# Patient Record
Sex: Female | Born: 1950 | Race: White | Hispanic: No | Marital: Married | Smoking: Never smoker
Health system: Southern US, Community
[De-identification: ages and names within clinical notes are randomized; demographics above are authoritative.]

## PROBLEM LIST (undated history)

## (undated) DIAGNOSIS — M609 Myositis, unspecified: Secondary | ICD-10-CM

## (undated) DIAGNOSIS — T884XXA Failed or difficult intubation, initial encounter: Secondary | ICD-10-CM

## (undated) DIAGNOSIS — M84352P Stress fracture, left femur, subsequent encounter for fracture with malunion: Secondary | ICD-10-CM

## (undated) DIAGNOSIS — N816 Rectocele: Secondary | ICD-10-CM

## (undated) DIAGNOSIS — M359 Systemic involvement of connective tissue, unspecified: Secondary | ICD-10-CM

## (undated) DIAGNOSIS — K8020 Calculus of gallbladder without cholecystitis without obstruction: Secondary | ICD-10-CM

## (undated) DIAGNOSIS — M069 Rheumatoid arthritis, unspecified: Secondary | ICD-10-CM

## (undated) DIAGNOSIS — N952 Postmenopausal atrophic vaginitis: Secondary | ICD-10-CM

## (undated) DIAGNOSIS — K469 Unspecified abdominal hernia without obstruction or gangrene: Secondary | ICD-10-CM

## (undated) DIAGNOSIS — J45909 Unspecified asthma, uncomplicated: Secondary | ICD-10-CM

## (undated) DIAGNOSIS — K219 Gastro-esophageal reflux disease without esophagitis: Secondary | ICD-10-CM

## (undated) DIAGNOSIS — IMO0002 Reserved for concepts with insufficient information to code with codable children: Secondary | ICD-10-CM

## (undated) HISTORY — PX: CARPAL TUNNEL RELEASE: SHX101

## (undated) HISTORY — DX: Rheumatoid arthritis, unspecified: M06.9

## (undated) HISTORY — PX: TOTAL HIP ARTHROPLASTY: SHX124

## (undated) HISTORY — PX: ELBOW SURGERY: SHX618

## (undated) HISTORY — DX: Postmenopausal atrophic vaginitis: N95.2

## (undated) HISTORY — DX: Unspecified abdominal hernia without obstruction or gangrene: K46.9

## (undated) HISTORY — DX: Reserved for concepts with insufficient information to code with codable children: IMO0002

## (undated) HISTORY — DX: Rectocele: N81.6

## (undated) HISTORY — DX: Myositis, unspecified: M60.9

## (undated) HISTORY — PX: COLONOSCOPY: SHX174

---
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
## 1978-11-25 HISTORY — PX: ABDOMINAL HYSTERECTOMY: SHX81

## 1984-11-25 HISTORY — PX: WRIST SURGERY: SHX841

## 1989-11-25 HISTORY — PX: TEMPOROMANDIBULAR JOINT ARTHROPLASTY: SUR76

## 1991-11-26 HISTORY — PX: OTHER SURGICAL HISTORY: SHX169

## 1991-11-26 HISTORY — PX: FOOT SURGERY: SHX648

## 1997-11-25 HISTORY — PX: THYROID SURGERY: SHX805

## 1998-11-25 HISTORY — PX: OTHER SURGICAL HISTORY: SHX169

## 1999-06-06 ENCOUNTER — Encounter: Payer: Self-pay | Admitting: Neurosurgery

## 1999-06-12 ENCOUNTER — Encounter: Payer: Self-pay | Admitting: Neurosurgery

## 1999-06-12 ENCOUNTER — Inpatient Hospital Stay (HOSPITAL_COMMUNITY): Admission: RE | Admit: 1999-06-12 | Discharge: 1999-06-13 | Payer: Self-pay | Admitting: Neurosurgery

## 1999-11-26 HISTORY — PX: SHOULDER ARTHROSCOPY: SHX128

## 2000-03-03 ENCOUNTER — Encounter: Payer: Self-pay | Admitting: Neurosurgery

## 2000-03-03 ENCOUNTER — Ambulatory Visit (HOSPITAL_COMMUNITY): Admission: RE | Admit: 2000-03-03 | Discharge: 2000-03-03 | Payer: Self-pay | Admitting: Neurosurgery

## 2000-11-25 HISTORY — PX: OTHER SURGICAL HISTORY: SHX169

## 2002-01-22 ENCOUNTER — Ambulatory Visit (HOSPITAL_COMMUNITY): Admission: RE | Admit: 2002-01-22 | Discharge: 2002-01-22 | Payer: Self-pay | Admitting: Neurosurgery

## 2002-01-22 ENCOUNTER — Encounter: Payer: Self-pay | Admitting: Neurosurgery

## 2005-09-05 ENCOUNTER — Ambulatory Visit: Payer: Self-pay | Admitting: Internal Medicine

## 2006-09-16 ENCOUNTER — Ambulatory Visit: Payer: Self-pay | Admitting: Internal Medicine

## 2007-01-15 ENCOUNTER — Ambulatory Visit: Payer: Self-pay | Admitting: Unknown Physician Specialty

## 2007-03-02 ENCOUNTER — Ambulatory Visit: Payer: Self-pay | Admitting: Internal Medicine

## 2007-03-04 ENCOUNTER — Ambulatory Visit: Payer: Self-pay | Admitting: Internal Medicine

## 2007-09-18 ENCOUNTER — Ambulatory Visit: Payer: Self-pay | Admitting: Internal Medicine

## 2007-11-26 HISTORY — PX: ANTERIOR AND POSTERIOR VAGINAL REPAIR: SUR5

## 2007-11-29 ENCOUNTER — Emergency Department: Payer: Self-pay | Admitting: Emergency Medicine

## 2008-02-22 ENCOUNTER — Ambulatory Visit (HOSPITAL_COMMUNITY): Admission: RE | Admit: 2008-02-22 | Discharge: 2008-02-22 | Payer: Self-pay | Admitting: Orthopedic Surgery

## 2008-02-29 ENCOUNTER — Inpatient Hospital Stay (HOSPITAL_COMMUNITY): Admission: RE | Admit: 2008-02-29 | Discharge: 2008-03-02 | Payer: Self-pay | Admitting: Orthopedic Surgery

## 2008-03-25 ENCOUNTER — Ambulatory Visit: Payer: Self-pay | Admitting: Urology

## 2008-07-05 ENCOUNTER — Other Ambulatory Visit: Payer: Self-pay

## 2008-07-05 ENCOUNTER — Ambulatory Visit: Payer: Self-pay | Admitting: Obstetrics and Gynecology

## 2008-07-11 ENCOUNTER — Ambulatory Visit: Payer: Self-pay | Admitting: Obstetrics and Gynecology

## 2008-09-20 ENCOUNTER — Ambulatory Visit: Payer: Self-pay | Admitting: Internal Medicine

## 2009-09-22 ENCOUNTER — Ambulatory Visit: Payer: Self-pay | Admitting: Internal Medicine

## 2009-12-27 ENCOUNTER — Encounter: Admission: RE | Admit: 2009-12-27 | Discharge: 2009-12-27 | Payer: Self-pay | Admitting: Neurosurgery

## 2010-06-26 ENCOUNTER — Encounter: Payer: Self-pay | Admitting: Unknown Physician Specialty

## 2010-09-25 ENCOUNTER — Ambulatory Visit: Payer: Self-pay | Admitting: Internal Medicine

## 2010-12-10 ENCOUNTER — Inpatient Hospital Stay (HOSPITAL_COMMUNITY)
Admission: RE | Admit: 2010-12-10 | Discharge: 2010-12-11 | Payer: Self-pay | Source: Home / Self Care | Attending: Neurosurgery | Admitting: Neurosurgery

## 2010-12-10 LAB — BASIC METABOLIC PANEL
BUN: 11 mg/dL (ref 6–23)
CO2: 29 mEq/L (ref 19–32)
Calcium: 9.4 mg/dL (ref 8.4–10.5)
Chloride: 105 mEq/L (ref 96–112)
Creatinine, Ser: 0.79 mg/dL (ref 0.4–1.2)
GFR calc Af Amer: >60 mL/min (ref >60)
GFR calc non Af Amer: >60 mL/min (ref >60)
Glucose, Bld: 94 mg/dL (ref 70–99)
Potassium: 4.5 mEq/L (ref 3.5–5.1)
Sodium: 142 mEq/L (ref 135–145)

## 2010-12-10 LAB — TYPE AND SCREEN
ABO/RH(D): A POS
Antibody Screen: NEGATIVE

## 2010-12-10 LAB — ABO/RH: ABO/RH(D): A POS

## 2010-12-10 LAB — DIFFERENTIAL
Basophils Absolute: 0 10*3/uL (ref 0.0–0.1)
Basophils Relative: 0 % (ref 0–1)
Eosinophils Absolute: 0.3 10*3/uL (ref 0.0–0.7)
Eosinophils Relative: 5 % (ref 0–5)
Lymphocytes Relative: 30 % (ref 12–46)
Lymphs Abs: 2 10*3/uL (ref 0.7–4.0)
Monocytes Absolute: 0.6 10*3/uL (ref 0.1–1.0)
Monocytes Relative: 10 % (ref 3–12)
Neutro Abs: 3.5 10*3/uL (ref 1.7–7.7)
Neutrophils Relative %: 55 % (ref 43–77)

## 2010-12-10 LAB — CBC
HCT: 41.6 % (ref 36.0–46.0)
Hemoglobin: 13.1 g/dL (ref 12.0–15.0)
MCH: 27.9 pg (ref 26.0–34.0)
MCHC: 31.5 g/dL (ref 30.0–36.0)
MCV: 88.5 fL (ref 78.0–100.0)
Platelets: 266 10*3/uL (ref 150–400)
RBC: 4.7 MIL/uL (ref 3.87–5.11)
RDW: 15.5 % (ref 11.5–15.5)
WBC: 6.4 10*3/uL (ref 4.0–10.5)

## 2010-12-10 LAB — SURGICAL PCR SCREEN
MRSA, PCR: NEGATIVE
Staphylococcus aureus: POSITIVE — AB

## 2010-12-18 NOTE — Op Note (Signed)
Crystal [name], Crystal [name]              ACCOUNT NO.:  [id number]  MEDICAL RECORD NO.:  [id number]          PATIENT TYPE:  INP  LOCATION:  3526                         FACILITY:  [facility]  PHYSICIAN:  Kathaleen Maser. Twisha Vanpelt, M.D.    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  PROCEDURE NAME:  Re-exploration of C1-C2, posterior cervical fusion with removal of hardware.  C1-C2 decompressive laminectomy.  Occiput to C1, C2, C3, C4 posterolateral fusion with lateral mass instrumentation and occipital plating.  Occiput to C1, C2, C3, C4 fusion with local autografting and bone graft extender.  SURGEON:  Kathaleen Maser. Theodoro Koval, MD  ASSISTANT:  Donalee Citrin, MD  ANESTHESIA:  General endotracheal.  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  The patient has subsequently since auto- fused her occiput to C1 and C2 down to C6.  There is still some instability at C1-C2 and some resultant basilar impression causing marked stenosis dorsally at C1.  The patient was counseled as to her options.  She is symptomatic with neck pain and symptoms of early upper cervical myelopathy.  We discussed options for management including operative and nonoperative care.  The patient decided to proceed with revision decompression and fusion in hopes of improving her symptoms.  OPERATIVE:  The patient was brought to the operating room, placed on operating table in supine position.  After an adequate level of anesthesia achieved, the patient was carefully turned into the prone position onto bolsters with her head fixed in a neutral head position in a [place] pin headrest.  The patient's posterior cervical region  and occiput region was prepped and draped sterilely.  A 10 blade was used to make a linear incision extending from her occiput down to approximately C4.  This was carried down sharply in the midline.  Subperiosteal dissection was then performed exposing the lamina of C1, C2, C3, and C4 as well as the facet joints of C2, C3, and C4 and the suboccipital bone. Deep self-retaining retractors were placed.  Intraoperative fluoroscopy was used for bony determination.  Previous fusion mass at C1-2 was dissected free.  The upper aspect of the fusion mass was resected. Interspinous wiring and sublaminar wires at C1-2 were then cut and removed.  A complete laminectomy of C1 was performed.  The lateral mass of the C1 were inspected for possible screw placement.  The anatomy was not favorable due to settling of the C1-2 interspace and very prominent vertebral arteries bilaterally.  For that reason, it was decided to perform occiput to C2-3 for instrumentation.  A Medtronic occipital plate was then secured in place with 6 screws.  Pars interarticularis screws at C2 were placed bilaterally using 13-mm variable-headed Vertex Max screws.  Lateral mass screws at C3-C4 were placed bilaterally, first by drilling pilot holes and then by tapping the pilot holes and then subsequent placing 14 mm variable-headed  screws.  The articulated rod was then cut and contoured to the proper position, it was then placed over the occipital plate and the screw heads at C2, C3, and C4 bilaterally.  Locking caps were placed over the screw heads.  Locking caps were then engaged.  Final images revealed good position of bone grafts, hardware at proper operative level with normal alignment of spine.  Further local autograft and Actifuse putty was then placed posterolaterally for later fusion.  Medium Hemovac drain was left in the epidural space.  Wound was then closed in layers with Vicryl sutures. Steri-Strips and sterile  dressings were applied.  There were no complications.  The patient tolerated the procedure well and she returned to the recovery room postop.          ______________________________ Kathaleen Maser. Terree Gaultney, M.D.     HAP/MEDQ  D:  12/10/2010  T:  12/11/2010  Job:  [id number]  Electronically Signed by Julio Sicks M.D. on 12/18/2010 08:15:39 AM

## 2011-02-14 ENCOUNTER — Other Ambulatory Visit: Payer: Self-pay | Admitting: Neurosurgery

## 2011-02-14 ENCOUNTER — Ambulatory Visit
Admission: RE | Admit: 2011-02-14 | Discharge: 2011-02-14 | Disposition: A | Discharge status: Home / Self Care | Payer: Medicare Other | Source: Ambulatory Visit | Attending: Neurosurgery | Admitting: Neurosurgery

## 2011-02-14 DIAGNOSIS — M4802 Spinal stenosis, cervical region: Secondary | ICD-10-CM

## 2011-02-14 DIAGNOSIS — M542 Cervicalgia: Secondary | ICD-10-CM

## 2011-04-09 NOTE — H&P (Signed)
[name], [name]              ACCOUNT NO.:  [id number]   MEDICAL RECORD NO.:  [id number]          PATIENT TYPE:  INP   LOCATION:  0006                         FACILITY:  [facility]   PHYSICIAN:  Ollen Gross, M.D.    DATE OF BIRTH:  02/17/1951   DATE OF ADMISSION:  02/29/2008  DATE OF DISCHARGE:                              HISTORY & PHYSICAL   DATE OF OFFICE VISIT AND HISTORY AND PHYSICAL:  February 12, 2008.   CHIEF COMPLAINT:  Right hip pain.   HISTORY OF PRESENT ILLNESS:  The patient is a 60 year old female who has  been seen by Dr. Lequita Halt for right hip instability.  She had a previous  right total hip by Dr. Kennith Center.  She has known significant rheumatoid  arthritis and has undergone multiple arthroplasty procedures.  She has  had both knees replaced, both hips replaced, an elbow replacement, and  ankle replacements.  The right hip, which was placed about 16 years ago,  has done well up until recently.  She had one episode about 3 years ago  when it slipped out and slid back in.  Unfortunately, back in December  2008, she had a fall and felt as though something shifted in her hip.  She has developed pain, increasing difficulty, and now has developed  some late instability problems.  She also is known to have significant  polyethylene wear.  It is felt she has reached the point where she would  benefit from undergoing revision surgery, possibly just head and liner,  but possibly an acetabular component if needed due to some osteolysis.  Risks and benefits of this procedure have been discussed with the  patient.  She elects to proceed with surgery.   ALLERGIES:  Multiple allergies, to include:  1. MORPHINE.  2. DEMEROL.  3. NALFON.  4. SULFASALAZINE.  5. HIGH DOSES OF B12, although she is able to take B12 by mouth in low      doses.   CURRENT MEDICATIONS:  .  Enbrel, Lodine, Zantac or Protonix, albuterol,  Colace, multivitamin, vitamin E, vitamin B6, calcium with D, B12 in  low  doses, Tylenol Arthritis, fish oil, and estrogen.   PAST MEDICAL HISTORY:  1. Rheumatoid arthritis.  2. History of bronchitis.  3. History of bladder prolapse, which she wears a pessary for, but      this will be removed prior to surgery.   PAST SURGICAL HISTORY:  Multiple joint arthroplasties, with bilateral  hip replacements, bilateral knee replacements, multiple hand joint  implants, including thumb.  She has undergone a total right elbow,  fusion of the left foot, and then a left total ankle.   SOCIAL HISTORY:  Married, retired on disability.  Nonsmoker.  No  alcohol.  Two children.  Husband and family will be assisting with care  after surgery.   FAMILY HISTORY:  Mother with history of hypertension, diabetes, and  stroke.  Brother with history of stroke, hypertension, and diabetes.  Grandmother with lung cancer.  Several aunts with breast cancer.   REVIEW OF SYSTEMS:  GENERAL:  No fevers, chills, or night sweats.  NEURO:  No seizures, syncope, or paralysis.  RESPIRATORY:  Little bit of  shortness of breath on exertion, but no shortness breath at rest.  No  productive cough or hemoptysis.  CARDIOVASCULAR:  No chest pain or  orthopnea.  GI:  No nausea, vomiting, diarrhea, or constipation.  GU: No dysuria, hematuria, or discharge.  MUSCULOSKELETAL:  Pertinent of that to the right hip.   PHYSICAL EXAMINATION:  VITAL SIGNS:  Pulse 72, respirations 14, blood  pressure 138/72.  GENERAL:  A 60 year old white female, well nourished, well developed,  pleasant, excellent historian, alert, oriented, and cooperative.  HEENT: Normocephalic, atraumatic.  Pupils round, reactive.  Oropharynx  clear.  EOMs intact.  NECK:  Supple.  No bruits.  CHEST:  Clear anterior and posterior chest walls.  No rhonchi, rales, or  wheezing.  HEART:  Regular rate and rhythm, without murmur.  S1, S2 noted.  ABDOMEN:  Soft, nontender.  Bowel sounds present.  RECTAL/BREASTS/GENITALIA:  Not done.  Not  pertinent to present illness.  EXTREMITIES:  Right hip:  Flexion 90, 20 degrees of abduction.  Motor  functions intact.   IMPRESSION:  Right hip instability.   PLAN:  The patient will be admitted to [facility] to undergo a  right hip polyethylene exchange versus an acetabular revision.  Surgery  will be performed by Dr. Ollen Gross.  Her medical physician, Dr.  Alonna Buckler, has seen the patient preoperatively and felt her to be  stable for surgery.      Alexzandrew L. Perkins, P.A.C.      Ollen Gross, M.D.  Electronically Signed    ALP/MEDQ  D:  02/25/2008  T:  02/26/2008  Job:  [id number]   cc:   Alonna Buckler, M.D.  [facility]

## 2011-04-09 NOTE — Op Note (Signed)
Crystal [name], Crystal [name]              ACCOUNT NO.:  [id number]   MEDICAL RECORD NO.:  [id number]          PATIENT TYPE:  INP   LOCATION:  1533                         FACILITY:  [facility]   PHYSICIAN:  Ollen Gross, M.D.    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   POSTOPERATIVE DIAGNOSES:  Right hip polyethylene wear with osteolysis  and instability.   PROCEDURE:  Right hip polyethylene liner and femoral head revision with  bone grafting.   SURGEON:  Ollen Gross, M.D.   ASSISTANT:  Avel Peace PA-C   ANESTHESIA:  Spinal.   ESTIMATED BLOOD LOSS:  200.   DRAIN:  None.   COMPLICATIONS:  None.   CONDITION:  Stable to recovery.   CLINICAL NOTE:  Crystal [name] is a 60 year old female with severe  rheumatoid arthritis, who had a right total hip done in the 1990s.  She  had done very well until recently in the past few months.  She started  to develop instability symptoms with dislocation and subsequent self  relocation.  This happened on several occasions.  She had radiographic  evidence of eccentric polyethylene wear with osteolysis.  Given the  instability and wear, it was decided to pursue revision hip replacement.  We did discuss in detail the possibility of just replacing the bearing  surfaces versus having to replace the acetabular component or femoral  component if they were loose or malpositioned.  She elected to proceed  with surgical treatment.   PROCEDURE IN DETAIL:  After the successful administration of spinal  anesthetic, the patient was placed in left lateral decubitus position  with the right side up and held with the hip positioner.  The right  lower extremity was isolated from the perineum with plastic drapes and  prepped and draped in the usual sterile fashion.  A short posterolateral  incision was made with 10 blade through subcutaneous tissue to the level  of the fascia lata, which was incised in line with the skin incision.  The sciatic nerve was palpated and protected, and short external  rotators isolated off the femur.  Our previous approach must have been  lateral as the short external rotators had not been touched before.  We  did incise the rotators and elevate them off the femur.  There was a  large swollen pseudocapsule.  I incised this and a tremendous amount of  osteolytic debris came out of the joint.  It was not a lot of fluid, it  was more tissue debris.  I debrided and removed the capsule and then  removed the wear debris.  She had a ceramic femoral head.  I dislocated  the hip and removed the femoral head.  The femoral component is in good  position and is well fixed.  We had a little bit of proximal bone loss  from stress shielding, but the component was solid.  There was a lot of  osteolytic debris along the  flexor tendon of the iliopsoas, and I  removed that debris.  We then retracted the femur anteriorly to gain  acetabular exposure.   The soft tissue overriding the acetabular liner was removed.  I was then  able to easily remove the acetabular liner from the shell.  She did have  evidence of eccentric, but not complete breakthrough.  The acetabular  component is well fixed.  I threaded the impactor into the central hole  of the acetabular shell and the shell and pelvis were moving together as  a single unit, consistent with a well ingrown shell.  Through the apex  hole, I was able to debride a fair amount of lytic debris.  We  thoroughly irrigated this and suctioned out a lot of the debris.  We  then packed cancellous bone graft, allograft freeze-dried into the  defect through the hole in the cup.  Once this was completed, then we  put a 32 mm trial liner, neutral +4 for the 52 mm Duraloc acetabular  shell.  We then placed a 32 mm +5 trial femoral  head.  The hip was  reduced with great stability.  There was full extension, full external  rotation, 70 degrees flexion, 40 degrees adduction, 90 degrees internal  rotation, 90 degrees of flexion and 70 degrees of internal rotation.  By  placing the right leg on top of the left, it fell like the leg lengths  were now equal.  The hip was then dislocated and the trials removed.  We  changed the locking ring with a Duraloc cup, replacing the old ring with  a new one.  We then placed the 32 mm neutral +4 Marathon liner for the  52 mm Duraloc cup.  We then placed a 32 +5 large tapered femoral head  for the AML stem.  The hip was reduced with the same stability  parameters.  The wound was copiously irrigated with saline solution.  The short rotators were reattached to the femur through drill holes with  Ethibond suture.  The fascia lata was closed with interrupted #1 Vicryl,  subcu closed with #1-0 and #2-0 Vicryl and skin with staples.  The  incisions cleaned and dried and a bulky sterile dressing applied.  She  was placed into a knee immobilizer, awakened and transported to recovery  in stable condition.      Ollen Gross, M.D.  Electronically Signed     FA/MEDQ  D:  02/29/2008  T:  02/29/2008  Job:  [id number]

## 2011-04-09 NOTE — Consult Note (Signed)
Crystal [name], Crystal [name]              ACCOUNT NO.:  [id number]   MEDICAL RECORD NO.:  [id number]          PATIENT TYPE:  INP   LOCATION:  0006                         FACILITY:  [facility]   PHYSICIAN:  Antony Contras, MD     DATE OF BIRTH:  Apr 28, 1951   DATE OF CONSULTATION:  02/22/2008  DATE OF DISCHARGE:                                 CONSULTATION   REQUESTING PHYSICIAN:  Dr. Lequita Halt.   CHIEF COMPLAINT:  Throat pain.   HISTORY OF PRESENT ILLNESS:  The patient is a 60 year old white female  with a history of rheumatoid arthritis pain who presented to the  operating room today in order to undergo hip surgery.  The patient has a  history of C1-C2 cervical spine fusion.  Intubation was attempted using  a glide scope but was unsuccessful.  During intubation attempts, a  laceration was seen in the pharyngeal wall that the anesthesiologist  described as a through-and-through laceration of the right  pharyngoepiglottic fold.  The surgery was cancelled and the patient was  brought to the recovery room.  In the recovery room, she complains of  throat pain that is moderate.  She is able to swallow secretions.  She  feels a need to hock up phlegm.  Otherwise, she is without complaint.   PAST MEDICAL HISTORY:  1. Rheumatoid arthritis.  2. Anemia.   PAST SURGICAL HISTORY:  1. Multiple orthopedic surgeries.  2. Thyroid surgery.  3. C1-C2 cervical fusion in 2000.  4. Hysterectomy.   MEDICATIONS:  Enbrel, Lodine, multivitamin, calcium, Protonix, fish oil,  Synthroid, vitamin E, vitamin B6, Colace, Tylenol.   ALLERGIES:  DEMEROL, MORPHINE, NALFON, SULFA, and B12.   FAMILY HISTORY:  Hypertension, diabetes, lung cancer, breast cancer,  colon cancer, stroke.   SOCIAL HISTORY:  The patient is married and is on disability.  She is  not a smoker and not a drinker. She has two children.   REVIEW OF SYSTEMS:  Positive for joint pain, joint swelling, fatigue and  shortness of breath and urinary  frequency.  All other systems are  negative except as listed above.   PHYSICAL EXAM:  VITAL SIGNS:  Afebrile. Vital signs stable.  GENERAL:  The patient is in no acute distress, pleasant and cooperative.  VOICE:  The voice is normal.  FACE:  There are no abnormalities of the face.  EYES:  Extraocular movements intact. Pupils are equal, round and  reactive to light.  NOSE:  The external nose is normal and the nasal passages are patent.  EARS:  External ears are normal and ear canals are patent.  ORAL CAVITY, OROPHARYNX:  Lips, teeth and gums are normal.  The tongue  is normal and the floor of the mouth is normal.  There is a small  laceration on the right soft palate and with some surrounding bruising.  The remainder of the oropharynx is normal without enlarged tonsils.  NECK:  The neck is normal to palpation with no mass or tenderness.  There is a thyroid scar inferiorly.  SALIVARY GLANDS:  The salivary glands are normal to palpation.  CRANIAL NERVES:  II-XII grossly intact.  LYMPHATICS:  There are no enlarged lymph nodes in the neck.  THYROID:  The thyroid is normal to palpation.   ASSESSMENT:  The patient is a 60 year old white female with rheumatoid  arthritis who sustained a pharyngeal laceration during a difficult  intubation.   PLAN:  The pharynx will be more thoroughly examined using fiberoptic  endoscope. Recommendations will follow in that dictation.      Antony Contras, MD  Electronically Signed     DDB/MEDQ  D:  02/22/2008  T:  02/23/2008  Job:  [id number]

## 2011-04-09 NOTE — Op Note (Signed)
NAMEJENNEY, [name]              ACCOUNT NO.:  [id number]   MEDICAL RECORD NO.:  [id number]          PATIENT TYPE:  INP   LOCATION:  0006                         FACILITY:  [facility]   PHYSICIAN:  Antony Contras, MD     DATE OF BIRTH:  01-Jun-1951   DATE OF PROCEDURE:  02/22/2008  DATE OF DISCHARGE:                               OPERATIVE REPORT   PREOPERATIVE DIAGNOSIS:  Pharyngeal laceration and throat pain.   POSTOPERATIVE DIAGNOSIS:  Pharyngeal laceration and throat pain.   PROCEDURE:  Transnasal fiberoptic laryngoscopy.   SURGEON:  Dr. Christia Reading   ANESTHESIA:  Topical.   COMPLICATIONS:  None.   INDICATIONS:  The patient 60 year old white female rheumatoid arthritis  who was scheduled for hip surgery today.  Intubation was difficult due  to a C1-C2 spinal fusion.  A glide scope was used but intubation could  not be performed successfully.  During evaluation, a pharyngeal  laceration was noted.  Fiberoptic examination of the pharynx is being  performed to evaluate the laceration.   FINDINGS:  The patient was found to have ecchymosis and edema of the  right posterior supraglottic larynx that was not obstructing.  There was  no obvious laceration seen on examination with the fiberoptic telescope  but this may not have been able to be seen at the pharyngoepiglottic  fold due to tongue position.  The tongue was protruded during the  examination but again no laceration was seen.  The glottis was found to  be normal with and normally mobile vocal folds.  There is no obstructing  edema seen.   DESCRIPTION OF PROCEDURE:  The patient was identified in the [facility]  recovery room.  The right nasal passage was dripped with lidocaine.  Fiberoptic laryngoscope was then passed through the right nasal passage  and used to view the pharynx and larynx.  Findings were noted above.  After this, the telescope was removed and the patient was returned to  PACU care.   RECOMMENDATIONS:   By the description of the anesthesiologist, the  laceration involved with a through-and-through laceration on the right  pharyngoepiglottic fold.  This should not cause any problems as far as  the neck infection or mediastinitis.  I do not see any other lacerations  except for one on the right soft palate that also should  not cause a problem.  I do not see any need for diet restrictions or  antibiotic therapy.  The patient should be fine to be discharged from  PACU without any additional therapy and without diet restrictions.  The  patient can be rescheduled for orthopedic surgery whenever it is  convenient.      Antony Contras, MD  Electronically Signed     DDB/MEDQ  D:  02/22/2008  T:  02/23/2008  Job:  [id number]

## 2011-04-12 NOTE — Discharge Summary (Signed)
Crystal [name], Crystal [name]              ACCOUNT NO.:  [id number]   MEDICAL RECORD NO.:  [id number]          PATIENT TYPE:  INP   LOCATION:  1609                         FACILITY:  [facility]   PHYSICIAN:  Ollen Gross, M.D.    DATE OF BIRTH:  1950-12-31   DATE OF ADMISSION:  02/29/2008  DATE OF DISCHARGE:  03/02/2008                               DISCHARGE SUMMARY   ADMISSION DIAGNOSES:  1. Hip instability, right hip.  2. Rheumatoid arthritis.  3. History of bronchitis.  4. History of bladder prolapse.   DISCHARGE DIAGNOSES:  1. 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  2. Rheumatoid arthritis.  3. History of bronchitis.  4. History of bladder prolapse.   PROCEDURE:  On February 29, 2008 right hip polyethylene liner, femoral head  revision with bone grafting.  Surgeon Dr. Lequita Halt, assistant Avel Peace, PA-C.  Spinal anesthesia.   CONSULTATIONS:  None.   BRIEF HISTORY:  [name] is a 59 year old female with severe  rheumatoid arthritis who had a total hip done in the 90s.  Doing very  well until recently.  Over the past few months, started developing  instability symptoms, dislocation and subsequent self-relocation.  This  has happened on several occasions.  Radiographically, she has some  eccentric polyethylene wear and osteolysis.  Given the instability and  wear, it was decided to proceed with hip revision.   LABORATORY DATA:  CBC on March 01, 2008 showed hemoglobin 10.8,  hematocrit 31.0, white cell count 7.6.  Followup hemoglobin 11 and a  hematocrit of 32.1.  PT/INR on March 01, 2008 showed PT of 14, INR 1.1.  Followup PT/INR 18.9 and 1.5.  BMET:  Low sodium of 134, remaining BMET  within normal limits.  Followup BMET all within normal limits, with the  exception of mildly elevated glucose of 125.   HOSPITAL COURSE:  The patient was admitted to [facility],  taken the OR, underwent the above stated procedure without complication.  The patient tolerated the procedure well and later transferred to the  recovery room and orthopedic floor.  Started on PCA and p.o. analgesics.  Doing extremely well on the morning of day #1.  Looked great, good  output.  Discontinued the PCA and knee immobilizer.  Decreased the  fluids.  Hemoglobin was 10.  Had a head and liner exchange.  Actually  got up and walked about 200 feet on day #1.  Was doing fantastic.  Seen  on the morning of day #2.  Felt good, only a little bit of incisional  burning.  She wanted to go home.  We felt that she needed a full day of  therapy, so we kept her in that day until the afternoon.  She was seen  by Dr. Lequita Halt, doing great, tolerating medications, and she was  discharged home on the evening of March 02, 2008.   DISPOSITION:  Patient discharged home on March 02, 2008.   DISCHARGE MEDICATIONS:  Darvocet, Robaxin, Coumadin.   DIET:  Heart-healthy diet.   ACTIVITY:  She is weightbearing as tolerated, right lower extremity.  Home health PT, home health nursing, total hip protocol, hip  precautions.   FOLLOW UP:  10 days after surgery.   CONDITION ON DISCHARGE:  Improved.      Alexzandrew L. Perkins, P.A.C.      Ollen Gross, M.D.  Electronically Signed    ALP/MEDQ  D:  04/07/2008  T:  04/07/2008  Job:  [id number]   cc:   Ollen Gross, M.D.  Fax: [phone]

## 2011-08-19 LAB — COMPREHENSIVE METABOLIC PANEL
ALT: 18
AST: 22
Albumin: 4.2
Alkaline Phosphatase: 97
BUN: 12
CO2: 27
Calcium: 9.9
Chloride: 104
Creatinine, Ser: 0.57
GFR calc Af Amer: >60
GFR calc non Af Amer: >60
Glucose, Bld: 100 — ABNORMAL HIGH
Potassium: 4.3
Sodium: 141
Total Bilirubin: 1
Total Protein: 7.8

## 2011-08-19 LAB — URINALYSIS, ROUTINE W REFLEX MICROSCOPIC
Glucose, UA: NEGATIVE
Hgb urine dipstick: NEGATIVE
Ketones, ur: NEGATIVE
Nitrite: NEGATIVE
Protein, ur: NEGATIVE
Specific Gravity, Urine: 1.005
Urobilinogen, UA: 0.2
pH: 7.5

## 2011-08-19 LAB — TYPE AND SCREEN
ABO/RH(D): A POS
Antibody Screen: NEGATIVE

## 2011-08-19 LAB — CBC
HCT: 40
Hemoglobin: 13.4
MCHC: 33.5
MCV: 88.9
Platelets: 319
RBC: 4.5
RDW: 13.7
WBC: 6.5

## 2011-08-19 LAB — URINE MICROSCOPIC-ADD ON

## 2011-08-19 LAB — PROTIME-INR
INR: 0.9
Prothrombin Time: 12.7

## 2011-08-19 LAB — APTT: aPTT: 32

## 2011-08-19 LAB — ABO/RH: ABO/RH(D): A POS

## 2011-08-20 LAB — BASIC METABOLIC PANEL
BUN: 6
CO2: 27
CO2: 28
Chloride: 105
Creatinine, Ser: 0.64
GFR calc Af Amer: >60
GFR calc Af Amer: >60
Glucose, Bld: 142 — ABNORMAL HIGH
Potassium: 3.8
Potassium: 4
Sodium: 134 — ABNORMAL LOW

## 2011-08-20 LAB — CBC
HCT: 31 — ABNORMAL LOW
HCT: 32.1 — ABNORMAL LOW
Hemoglobin: 10.8 — ABNORMAL LOW
MCHC: 34.4
MCHC: 34.7
MCV: 89.5
RBC: 3.49 — ABNORMAL LOW
RBC: 3.58 — ABNORMAL LOW
RDW: 13.3

## 2011-08-20 LAB — PROTIME-INR: Prothrombin Time: 18.9 — ABNORMAL HIGH

## 2011-08-20 LAB — TYPE AND SCREEN
ABO/RH(D): A POS
Antibody Screen: NEGATIVE

## 2011-10-14 ENCOUNTER — Ambulatory Visit: Payer: Self-pay | Admitting: Internal Medicine

## 2012-03-13 DIAGNOSIS — Z9660 Presence of unspecified orthopedic joint implant: Secondary | ICD-10-CM | POA: Insufficient documentation

## 2012-03-16 DIAGNOSIS — M069 Rheumatoid arthritis, unspecified: Secondary | ICD-10-CM | POA: Insufficient documentation

## 2012-03-16 DIAGNOSIS — M25579 Pain in unspecified ankle and joints of unspecified foot: Secondary | ICD-10-CM | POA: Insufficient documentation

## 2012-10-29 ENCOUNTER — Ambulatory Visit: Payer: Self-pay | Admitting: Internal Medicine

## 2013-11-01 ENCOUNTER — Ambulatory Visit: Payer: Self-pay | Admitting: Family Medicine

## 2013-12-28 ENCOUNTER — Ambulatory Visit: Payer: Self-pay | Admitting: Family Medicine

## 2014-01-19 ENCOUNTER — Ambulatory Visit: Payer: Self-pay | Admitting: Rheumatology

## 2014-04-08 LAB — COMPREHENSIVE METABOLIC PANEL
ALBUMIN: 3.4 g/dL (ref 3.4–5.0)
ANION GAP: 7 (ref 7–16)
AST: 15 U/L (ref 15–37)
Alkaline Phosphatase: 109 U/L
BILIRUBIN TOTAL: 0.9 mg/dL (ref 0.2–1.0)
BUN: 13 mg/dL (ref 7–18)
CALCIUM: 9.5 mg/dL (ref 8.5–10.1)
CREATININE: 0.44 mg/dL — AB (ref 0.60–1.30)
Chloride: 103 mmol/L (ref 98–107)
Co2: 30 mmol/L (ref 21–32)
EGFR (African American): >60
GLUCOSE: 87 mg/dL (ref 65–99)
Osmolality: 279 (ref 275–301)
Potassium: 3.8 mmol/L (ref 3.5–5.1)
SGPT (ALT): 16 U/L (ref 12–78)
Sodium: 140 mmol/L (ref 136–145)
TOTAL PROTEIN: 7.3 g/dL (ref 6.4–8.2)

## 2014-04-08 LAB — PROTIME-INR
INR: 1
PROTHROMBIN TIME: 12.9 s (ref 11.5–14.7)

## 2014-04-08 LAB — CBC WITH DIFFERENTIAL/PLATELET
BASOS ABS: 0 10*3/uL (ref 0.0–0.1)
BASOS PCT: 0.3 %
EOS ABS: 0 10*3/uL (ref 0.0–0.7)
EOS PCT: 0.1 %
HCT: 38 % (ref 35.0–47.0)
HGB: 12.4 g/dL (ref 12.0–16.0)
LYMPHS PCT: 27.2 %
Lymphocyte #: 1.4 10*3/uL (ref 1.0–3.6)
MCH: 28.9 pg (ref 26.0–34.0)
MCHC: 32.6 g/dL (ref 32.0–36.0)
MCV: 89 fL (ref 80–100)
MONO ABS: 0.4 x10 3/mm (ref 0.2–0.9)
Monocyte %: 7.8 %
Neutrophil #: 3.3 10*3/uL (ref 1.4–6.5)
Neutrophil %: 64.6 %
PLATELETS: 238 10*3/uL (ref 150–440)
RBC: 4.28 10*6/uL (ref 3.80–5.20)
RDW: 18.5 % — AB (ref 11.5–14.5)
WBC: 5.1 10*3/uL (ref 3.6–11.0)

## 2014-04-08 LAB — TROPONIN I

## 2014-04-08 LAB — APTT: ACTIVATED PTT: 37.1 s — AB (ref 23.6–35.9)

## 2014-04-09 ENCOUNTER — Ambulatory Visit: Payer: Self-pay | Admitting: Neurology

## 2014-04-09 DIAGNOSIS — I519 Heart disease, unspecified: Secondary | ICD-10-CM

## 2014-04-10 ENCOUNTER — Inpatient Hospital Stay: Payer: Self-pay | Admitting: Internal Medicine

## 2014-04-11 LAB — LIPID PANEL
Cholesterol: 109 mg/dL (ref 0–200)
HDL Cholesterol: 46 mg/dL (ref 40–60)
Ldl Cholesterol, Calc: 41 mg/dL (ref 0–100)
Triglycerides: 109 mg/dL (ref 0–200)
VLDL Cholesterol, Calc: 22 mg/dL (ref 5–40)

## 2014-04-13 LAB — COMPREHENSIVE METABOLIC PANEL
ALK PHOS: 102 U/L
ANION GAP: 5 — AB (ref 7–16)
AST: 28 U/L (ref 15–37)
Albumin: 3.4 g/dL (ref 3.4–5.0)
BUN: 12 mg/dL (ref 7–18)
Bilirubin,Total: 0.5 mg/dL (ref 0.2–1.0)
CALCIUM: 9.3 mg/dL (ref 8.5–10.1)
CO2: 29 mmol/L (ref 21–32)
CREATININE: 0.62 mg/dL (ref 0.60–1.30)
Chloride: 106 mmol/L (ref 98–107)
Glucose: 104 mg/dL — ABNORMAL HIGH (ref 65–99)
Osmolality: 279 (ref 275–301)
Potassium: 4 mmol/L (ref 3.5–5.1)
SGPT (ALT): 13 U/L (ref 12–78)
Sodium: 140 mmol/L (ref 136–145)
TOTAL PROTEIN: 7.5 g/dL (ref 6.4–8.2)

## 2014-04-13 LAB — TROPONIN I: Troponin-I: <0.02 ng/mL

## 2014-04-13 LAB — URINALYSIS, COMPLETE
BACTERIA: NONE SEEN
Bilirubin,UR: NEGATIVE
Blood: NEGATIVE
Glucose,UR: NEGATIVE mg/dL (ref 0–75)
KETONE: NEGATIVE
LEUKOCYTE ESTERASE: NEGATIVE
NITRITE: NEGATIVE
PH: 7 (ref 4.5–8.0)
Protein: NEGATIVE
RBC, UR: NONE SEEN /HPF (ref 0–5)
Specific Gravity: 1.005 (ref 1.003–1.030)
WBC UR: NONE SEEN /HPF (ref 0–5)

## 2014-04-13 LAB — CBC WITH DIFFERENTIAL/PLATELET
BASOS ABS: 0 10*3/uL (ref 0.0–0.1)
Basophil %: 0.4 %
EOS PCT: 0.1 %
Eosinophil #: 0 10*3/uL (ref 0.0–0.7)
HCT: 39.3 % (ref 35.0–47.0)
HGB: 12.8 g/dL (ref 12.0–16.0)
Lymphocyte #: 1.5 10*3/uL (ref 1.0–3.6)
Lymphocyte %: 32 %
MCH: 28.9 pg (ref 26.0–34.0)
MCHC: 32.5 g/dL (ref 32.0–36.0)
MCV: 89 fL (ref 80–100)
MONO ABS: 0.5 x10 3/mm (ref 0.2–0.9)
MONOS PCT: 10.9 %
NEUTROS PCT: 56.6 %
Neutrophil #: 2.7 10*3/uL (ref 1.4–6.5)
PLATELETS: 287 10*3/uL (ref 150–440)
RBC: 4.43 10*6/uL (ref 3.80–5.20)
RDW: 18 % — AB (ref 11.5–14.5)
WBC: 4.7 10*3/uL (ref 3.6–11.0)

## 2014-04-13 LAB — CK TOTAL AND CKMB (NOT AT ARMC)
CK, Total: 96 U/L
CK-MB: 2.1 ng/mL (ref 0.5–3.6)

## 2014-04-13 LAB — PROTIME-INR
INR: 0.9
PROTHROMBIN TIME: 12.4 s (ref 11.5–14.7)

## 2014-04-14 ENCOUNTER — Inpatient Hospital Stay: Payer: Self-pay | Admitting: Internal Medicine

## 2014-04-14 LAB — COMPREHENSIVE METABOLIC PANEL
ALK PHOS: 91 U/L
ANION GAP: 9 (ref 7–16)
Albumin: 3 g/dL — ABNORMAL LOW (ref 3.4–5.0)
BUN: 14 mg/dL (ref 7–18)
Bilirubin,Total: 0.4 mg/dL (ref 0.2–1.0)
CALCIUM: 8.9 mg/dL (ref 8.5–10.1)
CHLORIDE: 106 mmol/L (ref 98–107)
Co2: 26 mmol/L (ref 21–32)
Creatinine: 0.54 mg/dL — ABNORMAL LOW (ref 0.60–1.30)
EGFR (African American): >60
EGFR (Non-African Amer.): >60
GLUCOSE: 97 mg/dL (ref 65–99)
Osmolality: 282 (ref 275–301)
POTASSIUM: 3.7 mmol/L (ref 3.5–5.1)
SGOT(AST): 10 U/L — ABNORMAL LOW (ref 15–37)
SGPT (ALT): 10 U/L — ABNORMAL LOW (ref 12–78)
Sodium: 141 mmol/L (ref 136–145)
TOTAL PROTEIN: 6.8 g/dL (ref 6.4–8.2)

## 2014-04-14 LAB — CBC WITH DIFFERENTIAL/PLATELET
Basophil #: 0 10*3/uL (ref 0.0–0.1)
Basophil %: 0.5 %
EOS ABS: 0 10*3/uL (ref 0.0–0.7)
EOS PCT: 0.2 %
HCT: 36.3 % (ref 35.0–47.0)
HGB: 11.7 g/dL — ABNORMAL LOW (ref 12.0–16.0)
LYMPHS ABS: 1.7 10*3/uL (ref 1.0–3.6)
Lymphocyte %: 35 %
MCH: 28.8 pg (ref 26.0–34.0)
MCHC: 32.3 g/dL (ref 32.0–36.0)
MCV: 89 fL (ref 80–100)
Monocyte #: 0.5 x10 3/mm (ref 0.2–0.9)
Monocyte %: 10.1 %
NEUTROS ABS: 2.7 10*3/uL (ref 1.4–6.5)
NEUTROS PCT: 54.2 %
PLATELETS: 274 10*3/uL (ref 150–440)
RBC: 4.06 10*6/uL (ref 3.80–5.20)
RDW: 18.4 % — ABNORMAL HIGH (ref 11.5–14.5)
WBC: 5 10*3/uL (ref 3.6–11.0)

## 2014-04-14 LAB — PROTIME-INR
INR: 1
Prothrombin Time: 12.7 secs (ref 11.5–14.7)

## 2014-04-14 LAB — CSF CC+PROT+GLU+CULT PANEL
CSF Tube #: 1
EOS PCT: 0 %
Glucose, CSF: 50 mg/dL (ref 40–75)
Lymphocytes: 100 %
MONOCYTES/MACROPHAGES: 0 %
NEUTROS PCT: 0 %
OTHER CELLS: 0 %
PROTEIN, CSF: 118 mg/dL — AB (ref 15–45)
RBC (CSF): 109 /mm3
WBC (CSF): 3 /mm3

## 2014-04-14 LAB — CSF CELL COUNT WITH DIFFERENTIAL
CSF Tube #: 3
EOS PCT: 0 %
Lymphocytes: 0 %
MONOCYTES/MACROPHAGES: 0 %
Neutrophils: 0 %
Other Cells: 0 %
RBC (CSF): 49 /mm3
WBC (CSF): 0 /mm3

## 2014-04-14 LAB — INDIA INK CSF

## 2014-04-14 LAB — SEDIMENTATION RATE
Erythrocyte Sed Rate: 34 mm/hr — ABNORMAL HIGH (ref 0–30)
Erythrocyte Sed Rate: 38 mm/hr — ABNORMAL HIGH (ref 0–30)

## 2014-04-14 LAB — TROPONIN I

## 2014-04-14 LAB — MAGNESIUM: MAGNESIUM: 1.8 mg/dL

## 2014-04-18 LAB — CBC WITH DIFFERENTIAL/PLATELET
BASOS PCT: 0.1 %
Basophil #: 0 10*3/uL (ref 0.0–0.1)
Eosinophil #: 0 10*3/uL (ref 0.0–0.7)
Eosinophil %: 0 %
HCT: 33.6 % — ABNORMAL LOW (ref 35.0–47.0)
HGB: 11.1 g/dL — ABNORMAL LOW (ref 12.0–16.0)
Lymphocyte #: 1.6 10*3/uL (ref 1.0–3.6)
Lymphocyte %: 20.1 %
MCH: 29.1 pg (ref 26.0–34.0)
MCHC: 33.1 g/dL (ref 32.0–36.0)
MCV: 88 fL (ref 80–100)
Monocyte #: 0.5 x10 3/mm (ref 0.2–0.9)
Monocyte %: 6.8 %
Neutrophil #: 5.8 10*3/uL (ref 1.4–6.5)
Neutrophil %: 73 %
PLATELETS: 298 10*3/uL (ref 150–440)
RBC: 3.82 10*6/uL (ref 3.80–5.20)
RDW: 17.6 % — ABNORMAL HIGH (ref 11.5–14.5)
WBC: 7.9 10*3/uL (ref 3.6–11.0)

## 2014-04-18 LAB — BASIC METABOLIC PANEL
ANION GAP: 7 (ref 7–16)
BUN: 13 mg/dL (ref 7–18)
CALCIUM: 8.7 mg/dL (ref 8.5–10.1)
CO2: 28 mmol/L (ref 21–32)
CREATININE: 0.62 mg/dL (ref 0.60–1.30)
Chloride: 102 mmol/L (ref 98–107)
EGFR (African American): >60
Glucose: 115 mg/dL — ABNORMAL HIGH (ref 65–99)
Osmolality: 275 (ref 275–301)
POTASSIUM: 3.7 mmol/L (ref 3.5–5.1)
SODIUM: 137 mmol/L (ref 136–145)

## 2014-05-10 ENCOUNTER — Ambulatory Visit: Payer: Self-pay | Admitting: Neurology

## 2014-05-11 DIAGNOSIS — Z96629 Presence of unspecified artificial elbow joint: Secondary | ICD-10-CM | POA: Insufficient documentation

## 2014-08-12 DIAGNOSIS — E039 Hypothyroidism, unspecified: Secondary | ICD-10-CM | POA: Insufficient documentation

## 2014-09-20 DIAGNOSIS — K6289 Other specified diseases of anus and rectum: Secondary | ICD-10-CM | POA: Insufficient documentation

## 2014-09-20 DIAGNOSIS — Z8719 Personal history of other diseases of the digestive system: Secondary | ICD-10-CM | POA: Insufficient documentation

## 2014-10-19 ENCOUNTER — Ambulatory Visit: Payer: Self-pay | Admitting: Unknown Physician Specialty

## 2014-11-12 ENCOUNTER — Ambulatory Visit: Payer: Self-pay | Admitting: Neurology

## 2015-02-14 ENCOUNTER — Ambulatory Visit: Payer: Self-pay | Admitting: Family Medicine

## 2015-03-18 NOTE — Consult Note (Signed)
PATIENT NAME:  Crystal [name], Crystal [name] MR#:  [id number] DATE OF BIRTH:  1951-11-01  DATE OF CONSULTATION:  04/14/2014  REFERRING PHYSICIAN:   CONSULTING PHYSICIAN:  Dessie Coma., MD  REASON FOR CONSULTATION: Recurrent right body cerebrovascular accident.   HISTORY OF PRESENT ILLNESS: A 64 year old white female with history of rheumatoid arthritis, status post numerous agents. She was recently admitted with transient right body numbness beginning with headache involving the face, arm, and leg. She had MRI that showed a left parietal lesion and some flare. She is not hypertensive. She had no obvious arrhythmia. She had an echocardiogram that showed no clot. She was discharged on statins and aspirin. The following morning, i.e. yesterday, at 5:00 a.m., she awakened and she felt like her face was numb, some numbness around the right upper arm and right leg. She came back to the hospital. It persisted. The facial numbness persisted, but the arm and leg numbness resolved. She had no weakness. No speech difficulty. No difficulty thinking. She did not have headache. Repeat MRI showed a second lesion on the left parietal area.   She has not had blood clots. She has had numerous surgeries. She did take Coumadin around the time of hip surgery, but had no recorded DVT or pulmonary embolism. She has not had arrhythmias. She has been on telemetry in the hospital without irregularity. Symptoms have resolved. She has no current headache. He has not had particular neck symptoms.   PAST MEDICAL HISTORY: Well outlined in prior note, rheumatoid arthritis, status post numerous agents.   PHYSICAL EXAMINATION: Blood pressure 141/87, temperature 97, pulse 83 and regular, respiratory rate 20, pulse oximetry 94. A pleasant female. Regular rhythm. No murmur. No neck bruit. No significant edema. She has no drift or droop. Chronic rheumatoid changes in her shoulders and hands, but no change in range of motion exam from  prior visits. Speech articulates well. She has reasonable grimace. She has symmetric reflexes in upper and lower extremities.   IMPRESSION:  1.  Recurrent cerebrovascular accident. No definite arrhythmia. Raised question of hypercoagulable state. Prior vasculature on CTA was unremarkable. Not enough evidence of MS-like reaction to Humira.  2.  Long-standing rheumatoid arthritis.  3.  Recent thyroid biopsy.   RECOMMENDATIONS:  1.  I agree with neurology opinion.  2.  We will check anticardiolipin antibody and lupus anticoagulant to rule out immune anticoagulant, which could have possibly been acquired. Discussed with team as to whether she needs further anticoagulable workup with hematology. Raising the question of whether she should have other anticoagulation.   ____________________________ Dessie Coma., MD gwk:aw D: 04/14/2014 13:55:32 ET T: 04/14/2014 14:11:23 ET JOB#: [id number]  cc: Dessie Coma., MD, <Dictator> Webb Silversmith MD ELECTRONICALLY SIGNED 04/14/2014 17:37

## 2015-03-18 NOTE — H&P (Signed)
PATIENT NAME:  Crystal [name], Crystal [name] MR#:  [id number] DATE OF BIRTH:  02-Aug-1951  DATE OF ADMISSION:  04/13/2014  PRIMARY CARE PHYSICIAN: Dr. Kandyce Rud at [facility].      REQUESTING PHYSICIAN: Dr. Bayard Males.   CHIEF COMPLAINT: Right-sided numbness and tingling.   HISTORY OF PRESENT ILLNESS: The patient is a 64 year old female who was in the hospital from 15th of May until 19th of May, was discharged with likely new CVA, comes back with right-sided tingling and numbness. This was a similar symptom that she was admitted with last time and had extensive neurological work-up, and final MRI with contrast yesterday confirmed acute left parietal lobe infarct, after which she was discharged home as her symptoms were resolved and she was back to normal. She did okay going home but when she woke up, she started having facial numbness around 5:30 and the numbness progressed to the entire right side, and decided to come back to the Emergency Department. While in the ED, she continued to have her symptoms and underwent CT scan of the head, which was negative, and she is being admitted for further evaluation and management.   PAST MEDICAL HISTORY: 1.  Severe rheumatoid arthritis. 2.  Osteoporosis. 3.  GERD. 4.  Hypothyroidism.   ALLERGIES:  1.  ARAVA. 2.  DAYPRO. 3.  FOSAMAX. 4.  REMICADE. 5.  VOLTAREN. 6.  DEMEROL. 7.  SULFA. 8.  MORPHINE. 9.  SULFASALAZINE.  SOCIAL HISTORY: No smoking. No alcohol. No illicit drug use. She lives at home with her husband.   FAMILY HISTORY: Father died from suicide. Mother lives at nursing home. She did have a history of stroke.   MEDICATIONS AT HOME: From discharge yesterday are as follows:  1.  Vitamin B12 once daily.  2.  Vitamin E 400 international units once daily.  3.  Tylenol Arthritis 650 mg 2 tablets p.o. b.i.d.  4.  Humira 1 dose subcutaneous once a month. 5.  Calcium with vitamin D once daily.  6.  Colace 1 capsule p.o. daily.  7.  Multivitamin  with iron once daily.  8.  Synthroid 50 mcg p.o. daily.  9.  Etodolac 500 mg p.o. twice a day.  10.  Methotrexate 2.5 mg 2 tablets p.o. once a week on Tuesdays.  11.  Folic acid 1 mg 2 tablets p.o. once a week on Tuesday.  12.  Omeprazole 20 mg p.o. daily.  13.  Fish oil 1 capsule p.o. daily.  14.  Aspirin 81 mg p.o. daily.  15.  Simvastatin 20 mg p.o. at bedtime.   REVIEW OF SYSTEMS:    CONSTITUTIONAL: No fever, fatigue, weakness.  EYES: No blurred or double vision.  ENT: No tinnitus, ear pain.  RESPIRATORY: No cough, wheezing, hemoptysis.  CARDIOVASCULAR: No chest pain, orthopnea, edema.  GASTROINTESTINAL: No nausea, vomiting, diarrhea. GENITOURINARY: No dysuria, hematuria. ENDOCRINE: No polyuria or nocturia.  HEMATOLOGIC: No anemia or easy bruising.  SKIN: No rash or lesion.  MUSCULOSKELETAL: Severe rheumatoid arthritis with deformities.  NEUROLOGIC: Tingling and numbness present on the right upper extremity more than right lower extremity.  PSYCHIATRIC: No history of anxiety or depression.   PHYSICAL EXAMINATION: VITAL SIGNS: Temperature 98, heart rate 97 per minute, respirations 18 per minute, blood pressure 167/91 mmHg. She is saturating 97% on room air.  GENERAL: The patient is a 64 year old female lying in the bed comfortably without any acute distress.  EYES: Pupils equal, round, reactive to light and accommodation. No scleral icterus. Extraocular muscles intact.  HENT: Head atraumatic, normocephalic. Oropharynx and nasopharynx clear.  NECK: Supple. No jugular venous distention. No thyroid enlargement or tenderness.  LUNGS: Clear to auscultation bilaterally. No wheezing, rales, rhonchi, or crepitation.  CARDIOVASCULAR: S1, S2 normal. No murmurs, rubs, or gallop.  ABDOMEN: Soft, nontender, nondistended. Bowel sounds present. No organomegaly or mass.  EXTREMITIES: No pedal edema, cyanosis, or clubbing, 2+ pedal pulses bilaterally. She does have significant deformity of her  upper extremities from severe rheumatoid arthritis. She also had cervical fusion done.  NEUROLOGIC: Cranial nerves II through XII intact. Muscle strength 5/5 in all extremities. Sensation loss more on the right upper extremity than right lower extremity. Reflexes 2+. No other deficit noted.  SKIN: Moist and warm without rashes.  MUSCULOSKELETAL: No joint effusion. She does have severe deformity from rheumatoid arthritis.   LABORATORY AND RADIOLOGICAL DATA: Normal BMP. Normal liver function tests. Normal CBC. Normal first set of cardiac enzymes. Negative urinalysis. A normal coagulation panel.   CT scan of the head in the ED was negative for any acute pathology.   IMPRESSION AND PLAN: 1.  Headache and right-sided tingling and numbness of the upper extremity: Worrisome for cerebrovascular accident or extension of previous cerebrovascular accident. Will get a neuro consult. Will repeat MRI of the brain in the morning. Hold off on any further testing at this time.  2.  History of rheumatoid arthritis: Will get a rheumatology consult. Discuss with Dr. Lavenia Atlas.  3.  Gastroesophageal reflux disease: Will continue Protonix. 4.  Hypothyroidism: Will continue Synthroid.   CODE STATUS: Full code.   TIME SPENT: Total time taking care of this patient was 45 minutes.    ____________________________ Ellamae Sia. Sherryll Burger, MD vss:jcm D: 04/13/2014 16:00:27 ET T: 04/13/2014 17:41:44 ET JOB#: [id number]  cc: Rande Roylance S. Sherryll Burger, MD, <Dictator> Kandyce Rud, MD Ellamae Sia [facility] MD ELECTRONICALLY SIGNED 04/19/2014 11:48

## 2015-03-18 NOTE — Consult Note (Signed)
Referring Physician:  Remer Macho :   Primary Care Physician:  [facility], [address], [place], [place] [address], [phone]  Reason for Consult: Admit Date: 13-Apr-2014  Chief Complaint: headache and R sided weakness  Reason for Consult: CVA   History of Present Illness: History of Present Illness:   64 yo RHD F presents to [facility] secondary to multiple episodes or R sided numbness.  Pt states that she had at least 4 different episodes.  Most start in the hand and travel up arm then to leg on the R and last for about 2 hours.  Pt reports headache prior to most of these as well and the fact that she has been having more persistent headaches.  She denies any shaking or seizure like activity.  She does report some vision changes on the R too.    ROS:  General denies complaints   HEENT R blurry vision   Lungs no complaints   Cardiac no complaints   GI no complaints   GU no complaints   Musculoskeletal no complaints   Extremities no complaints   Skin no complaints   Neuro headache  numbness/tingling   Endocrine no complaints   Psych no complaints   Past Medical/Surgical Hx:  left wrist fracture:   medication induced lupus:   multiple finger dislocation:   left arm fracture, improper healing:   Rheumatoid Arthritis:   right upper arm fracture with rodding:   ankle surgery:   total knee replacement:   total hip replacement:   Total Joint Replacements: Bil. Knees, Bil. 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:   Hysterectomy:   multiple joint replacements:   Past Medical/ Surgical Hx:  Past Medical History as above   Past Surgical History multiple as above   Home Medications: Medication Instructions Last Modified Date/Time  simvastatin 20 mg oral tablet 1 tab(s) orally once a day (at bedtime) 20-May-15 11:06  aspirin 81  mg oral delayed release tablet 1 tab(s) orally once a day 20-May-15 11:06  Vitamin B12 500 mcg oral tablet 1 tab(s) orally once a day  20-May-15 11:06  vitamin E 400 intl units oral capsule 1 cap(s) orally once a day  20-May-15 11:06  Tylenol Arthritis Caplet 650 mg oral tablet 2 tabs (1332m) orally 2 times a day  20-May-15 11:06  Calcium 600+D 1 tab(s) orally once a day 20-May-15 11:06  docusate sodium sodium 100 mg oral capsule 1 cap(s) orally once a day 20-May-15 11:06  multivitamin with iron 1 tab(s) orally once a day 20-May-15 11:06  Synthroid 50 mcg (0.05 mg) oral tablet 1 tab(s) orally once a day 20-May-15 11:06  Fish Oil - oral capsule 1 cap(s) orally once a day 20-May-15 11:06  omeprazole 20 mg oral delayed release capsule 1 cap(s) orally once a day 20-May-15 11:06   Allergies:  Gold Containing compounds: SOB  Vitamin B 12: Wheezing, Other  Arava: Other  Daypro: Other  Fosamax: Other  Voltaren: Other  Sulfasalazine: Unknown  Nalfon: Unknown  Demerol: Unknown  Morphine: Unknown  Sulfa: Other  Remicade: Other  Allergies:  Allergies as above   Social/Family History: Employment Status: disabled  Lives With: spouse  Social History: no tob, no EtOH, no illicits  Family History: no stroke or seizure   Vital Signs: **Vital Signs.:   21-May-15 08:14  Vital Signs Type Routine  Temperature Temperature (F) 98.1  Celsius 36.7  Temperature Source oral  Pulse Pulse 95  Respirations Respirations 18  Systolic BP Systolic BP 048  Diastolic BP (mmHg) Diastolic BP (mmHg) 86  Mean BP 104  Pulse Ox % Pulse Ox % 97  Pulse Ox Activity Level  At rest  Oxygen Delivery Room Air/ 21 %   Physical Exam: General: slightly overweight, NAD  HEENT: normocephalic, sclera nonicteric, oropharynx clear  Neck: supple, no JVD, no bruits  Chest: CTA B, no wheezing, good movement  Cardiac: RRR, no murmurs, no edema, 2+ pulses  Extremities: marked joint abnormalities with swelling in hands  and feet   Neurologic Exam: Mental Status: alert and oriented x 3, normal speech and language, follows complex commands  Cranial Nerves: PERRLA, EOMI, L homonymous hemianopsia, face symmetric, tongue midline, shoulder shrug equal  Motor Exam: 5-/5 B but limited by joint movement, nl tone, no tremor  Deep Tendon Reflexes: 1+/4 B, down going plantars B  Sensory Exam: pinprick, temperature, and vibration intact B  Coordination: FTN and HTS WNL, nl RAM, nl gait   Lab Results: Hepatic:  21-May-15 00:04   Bilirubin, Total 0.4  Alkaline Phosphatase 91 (45-117 NOTE: New Reference Range 10/15/13)  SGPT (ALT)  10  SGOT (AST)  10  Total Protein, Serum 6.8  Albumin, Serum  3.0  Routine Chem:  21-May-15 00:04   Glucose, Serum 97  BUN 14  Creatinine (comp)  0.54  Sodium, Serum 141  Potassium, Serum 3.7  Chloride, Serum 106  CO2, Serum 26  Calcium (Total), Serum 8.9  Osmolality (calc) 282  eGFR (African American) >60  eGFR (Non-African American) >60 (eGFR values <11m/min/1.73 m2 may be an indication of chronic kidney disease (CKD). Calculated eGFR is useful in patients with stable renal function. The eGFR calculation will not be reliable in acutely ill patients when serum creatinine is changing rapidly. It is not useful in  patients on dialysis. The eGFR calculation may not be applicable to patients at the low and high extremes of body sizes, pregnant women, and vegetarians.)  Anion Gap 9  Magnesium, Serum 1.8 (1.8-2.4 THERAPEUTIC RANGE: 4-7 mg/dL TOXIC: > 10 mg/dL  -----------------------)  Cardiac:  20-May-15 06:32   CK, Total 96 (26-192 NOTE: NEW REFERENCE RANGE  12/27/2013)  CPK-MB, Serum 2.1 (Result(s) reported on 13 Apr 2014 at 07:11AM.)  21-May-15 00:04   Troponin I < 0.02 (0.00-0.05 0.05 ng/mL or less: NEGATIVE  Repeat testing in 3-6 hrs  if clinically indicated. >0.05 ng/mL: POTENTIAL  MYOCARDIAL INJURY. Repeat  testing in 3-6 hrs if  clinically  indicated. NOTE: An increase or decrease  of 30% or more on serial  testing suggests a  clinically important change)  Routine UA:  20-May-15 08:02   Color (UA) Straw  Clarity (UA) Clear  Glucose (UA) Negative  Bilirubin (UA) Negative  Ketones (UA) Negative  Specific Gravity (UA) 1.005  Blood (UA) Negative  pH (UA) 7.0  Protein (UA) Negative  Nitrite (UA) Negative  Leukocyte Esterase (UA) Negative (Result(s) reported on 13 Apr 2014 at 08:34AM.)  RBC (UA) NONE SEEN  WBC (UA) NONE SEEN  Bacteria (UA) NONE SEEN  Epithelial Cells (UA) <1 /HPF (Result(s) reported on 13 Apr 2014 at 08:34AM.)  Routine Coag:  21-May-15 00:04   Prothrombin 12.7  INR 1.0 (INR reference interval applies to patients on anticoagulant therapy. A single INR therapeutic range for coumarins is not optimal for all indications; however, the suggested range for  most indications is 2.0 - 3.0. Exceptions to the INR Reference Range may include: Prosthetic heart valves, acute myocardial infarction, prevention of myocardial infarction, and combinations of aspirin and anticoagulant. The need for a higher or lower target INR must be assessed individually. Reference: The Pharmacology and Management of the Vitamin K  antagonists: the seventh ACCP Conference on Antithrombotic and Thrombolytic Therapy. WUJWJ.1914 Sept:126 (3suppl): N9146842. A HCT value >55% may artifactually increase the PT.  In one study,  the increase was an average of 25%. Reference:  "Effect on Routine and Special Coagulation Testing Values of Citrate Anticoagulant Adjustment in Patients with High HCT Values." American Journal of Clinical Pathology 2006;126:400-405.)  Routine Hem:  21-May-15 00:04   WBC (CBC) 5.0  RBC (CBC) 4.06  Hemoglobin (CBC)  11.7  Hematocrit (CBC) 36.3  Platelet Count (CBC) 274  MCV 89  MCH 28.8  MCHC 32.3  RDW  18.4  Neutrophil % 54.2  Lymphocyte % 35.0  Monocyte % 10.1  Eosinophil % 0.2  Basophil % 0.5   Neutrophil # 2.7  Lymphocyte # 1.7  Monocyte # 0.5  Eosinophil # 0.0  Basophil # 0.0 (Result(s) reported on 14 Apr 2014 at 12:27AM.)    14:19   Erythrocyte Sed Rate  34 (Result(s) reported on 14 Apr 2014 at 03:14PM.)   Radiology Results: CT:    20-May-15 07:16, CT Head Without Contrast  CT Head Without Contrast   REASON FOR EXAM:    right face/arm/leg weakness/numbness  COMMENTS:       PROCEDURE: CT  - CT HEAD WITHOUT CONTRAST  - Apr 13 2014  7:16AM     CLINICAL DATA:  Right-sided weakness    EXAM:  CT HEAD WITHOUT CONTRAST    TECHNIQUE:  Contiguous axial images were obtained from the base of the skull  through the vertex without intravenous contrast.    COMPARISON:  Head CT 04/10/2014  FINDINGS:  Craniocervical fusion is noted in occiput. There is significant  streak artifact through the posterior fossa from the fusion  hardware.    No acute intracranial hemorrhage. No focal mass lesion. No CT  evidence of acute infarction. No midline shift or mass effect. No  hydrocephalus. Basilar cisterns are patent.    Paranasal sinuses are clear.  The mastoid air cells are clear.     IMPRESSION:  No acute intracranial findings.  No change from prior.    Electronically Signed    By: Suzy Bouchard M.D.    On: 04/13/2014 07:22         Verified By: Rennis Golden, M.D.,   Radiology Impression: Radiology Impression: MRI of brain personally reviewed by me and shows L hemispheric flair changes along the gyri and a small L parietal DWI change   Impression/Recommendations: Recommendations:   previous notes reviewed by me reviewed by me   Possible CNS infection vs. autoimmune disorder-  the FLAIR changes on MRI are quite abnormal and are concerning for inflammation.  This could cause pt's symptoms Small L parietal infarct-  incidental finding and not cause of symptoms Probable simple partial seizure-  pt clearly describes a Jacksonian march and this is likely epileptic  activity from 1. needs LP to look for infection start Trileptal 373m BID x 1 week, then 6046mBID check ESR, CPK and ANA panel ok to continue ASA will follow this interesting case  Electronic Signatures: SmJamison NeighborMD)  (Signed 21-May-15 16:16)  Authored: REFERRING PHYSICIAN, Primary Care Physician, Consult, History of Present Illness,  Review of Systems, PAST MEDICAL/SURGICAL HISTORY, HOME MEDICATIONS, ALLERGIES, Social/Family History, NURSING VITAL SIGNS, Physical Exam-, LAB RESULTS, RADIOLOGY RESULTS, Recommendations   Last Updated: 21-May-15 16:16 by Jamison Neighbor (MD)

## 2015-03-18 NOTE — Consult Note (Signed)
PATIENT NAME:  Crystal [name], Crystal [name] MR#:  [id number] DATE OF BIRTH:  10-06-1951  DATE OF CONSULTATION:  04/11/2014  REFERRING PHYSICIAN:   CONSULTING PHYSICIAN:  Dessie Coma., MD  REASON FOR CONSULTATION:  Rheumatoid arthritis, mental status changes.   HISTORY OF PRESENT ILLNESS: A 64 year old white female with long history of rheumatoid arthritis, status post numerous remittive agents. She did have an episode several years ago of transient right visual changes. She was seen by ophthalmology. She had echocardiogram and thought to have had a transient ischemic attack. She has not currently been on aspirin because she has been on etodolac. She recently had thyroid biopsy. She was on Orencia, we switched her to Humira recently. She was working up the dose of that. She has taken 3 doses of the Humira. This past Friday, she awakened with a headache. She is not she used to having headaches. About 3 hours to 4 hours later, she had the onset of numbness in her right body, right leg, top of the foot, the right hand, particularly the 4th and 5th finger, the right face and right jaw. It lasted for a few minutes and then resolved. About 4 to 5 hours later, she had the episode again. She still has a little bit of tingling in the right 2nd finger. She felt like that side of the body had gone to the dentist. She has not had an episode since. She had mildly elevated blood pressure at 140. She is not on antihypertensives. She did not have any changes in her speech, swallowing difficulty or shortness of breath. She had no tremor. She had workup with a CT angiogram which was negative. She had an MRI which had some mild change in the right parietal lobe, possible microhemorrhage, but also a change more in the left subarachnoid space, could be meningitis or hemorrhage. She has not had fever. Headache has gone away.  She has not been in any irregular rhythm. Echocardiogram and ultrasound of the neck are unremarkable.  She had attempted a lumbar puncture which was unremarkable. White count is normal. She has not felt like she has had a flare of her joints. She had recent thyroid biopsy, which was benign.    PAST MEDICAL HISTORY:  Rheumatoid arthritis, low B12 with allergy to exogenous B12, hypertension mildly exacerbated by nonsteroidals, osteopenia, asthma, near syncope thyroid nodules.   PAST SURGICAL HISTORY: Thyroid nodule resection, bilateral knee replacements, right elbow replacement, bilateral hip replacements, left ankle replacement, left temporomandibular joint replacement, right hip revision, bladder tac-up, cervical fusion for rheumatoid neck disease.   SOCIAL HISTORY: No cigarettes or alcohol.   FAMILY HISTORY: Negative for rheumatoid arthritis.   PHYSICAL EXAMINATION: GENERAL: Pleasant female sitting in chair in no acute distress.  VITAL SIGNS:  Blood pressure 144/88, temperature 98, pulse 101, afebrile, normal oxygen saturation.  HEENT: Sclerae clear. Reactive pupils, good carotid upstroke. No bruit.  HEART:  Regular rhythm. Clear chest.  No murmur. No significant edema.  ABDOMEN:  No visceromegaly.  MUSCULOSKELETAL: Marked decreased range of cervical spine. Decreased abduction and external rotation of shoulders. Hands with chronic rheumatoid changes, status post procedures. Hips move reasonably.  NEUROLOGIC: No drift or droop. Finger-to-nose reasonable. Symmetric reflexes and power.   IMPRESSION: 1.  Sudden onset of neurologic event with headache, transient sensory symptoms with a prior history of a transient ischemic attack. Most likely vascular event with transient ischemic attack.  She is mildly hypertensive. She has been on nonsteroidal anti-inflammatory drugs. She does have  rheumatoid arthritis.  2.  Less likely reaction to Humira of though the tumor necrosis factor drugs can cause neurologic symptoms. Usually they are more chronic and usually they have the pattern of multiple sclerosis on  scanning.  3.  Less likely infection given the fact she is not very toxic. White count is not up.  She has not had fever. Little suspicion of vasculitis. An intracranial vasculitis is not really associated with rheumatoid arthritis. She has been on immunosuppressive agents. There is no evidence on her CT angiogram of any blood vessel narrowing of the medial vessel zones.  4.  Severe rheumatoid arthritis, status post numerous agents and numerous procedures.   RECOMMENDATIONS: 1.  She has been unable to get a lumbar puncture because of technical reasons. Another approach would be to rescan her, perhaps with contrast, to get a better definition of her changes on her MR. If there is no evidence of hemorrhage, then perhaps she should be treated as if she had had a transient ischemic attack with antiplatelet agent. Blood pressure monitoring, cholesterol monitoring and modification.   2.  Would hold her Humira and we will consider another agent since she has had a neurologic event while on that drug.    3.  No reason for steroids unless she were to have MS lesions on MR that looked inflammatory.    ____________________________ Dessie Coma., MD gwk:cs D: 04/11/2014 13:49:00 ET T: 04/11/2014 14:15:49 ET JOB#: [id number]  cc: Dessie Coma., MD, <Dictator> Webb Silversmith MD ELECTRONICALLY SIGNED 04/11/2014 17:34

## 2015-03-18 NOTE — Consult Note (Signed)
Brief Consult Note: Patient was seen by consultant.   Consult note dictated.   Orders entered.   Comments: new infarct on MRI. sx now resolved.no obvious arrhythmia. will check anticoagulant abs (though no prior hx of PE or DVT) await neuro opinion.  Has cranial circulation been adequately imaged with prior CTA? does she need anticoagulation?.  Electronic Signatures: Royann Shivers., Helen Hashimoto (MD)  (Signed 21-May-15 13:49)  Authored: Brief Consult Note   Last Updated: 21-May-15 13:49 by Royann Shivers., Helen Hashimoto (MD)

## 2015-03-18 NOTE — Consult Note (Signed)
PATIENT NAME:  Crystal [name], SCHWARZ MR#:  [id number] DATE OF BIRTH:  1951/05/14  DATE OF CONSULTATION:  04/09/2014  REFERRING PHYSICIAN:  Internal medicine hospitalist CONSULTING PHYSICIAN:  Weston Settle, MD  REASON FOR CONSULTATION:  Right-sided numbness.   HISTORY OF PRESENT ILLNESS:  The patient is a pleasant 64 year old white female who was admitted yesterday.  At that time she developed acute onset of right leg, arm, face, and right-sided tongue numbness and tingling.  This lasted for about two hours before resolving completely.  There was no associated weakness.  There was no dysarthria and no language impairment.  While in the hospital the patient had a second occurrence of a similar phenomenon except this time it was mainly involving the right arm and right leg only and it lasted for about 45 minutes before resolving.  The patient does not have any stroke risk factors including no prior history of hypertension, hyperlipidemia, diabetes, smoking or illicit drug use such as cocaine or amphetamines.  The patient has had no antiplatelet intake before this happened.  She was given aspirin in the Emergency Room, however.  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  Initially, she was treated with Enbrel for 12 years, but seemed to get refractory to its effects and she was changed to Remicade infusions.  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  She was then switched to Orencia for three years.  This is a T-cell modulator.  She apparently became refractory to its effects as well and she was taken off that and started on Humira three months ago.  However, due to her prior bad experience with disease modifying therapies she was started as once a month of Humira instead of once every two weeks.  After the first dose she developed pneumonia and bronchitis which was not clear if it  was drug related or just a coincidence.  She did her second shot which was uneventful and after the third shot one week ago she then developed these symptoms.  Upon arrival to the hospital the patient had a normal CBC, complete metabolic panel, troponin I, coagulation profile and telemetry shows normal sinus rhythm.  Carotid Doppler ultrasound is normal.  CT scan of the brain was performed which I reviewed personally.  It indicates no acute hemorrhages.  The ventricles are normal and there are no abnormal hypodensities present.  Follow-up MRI of the brain was performed which I reviewed personally.  It indicates bifrontal white matter lesions of nonspecific nature.  There is also more prominent periventricular lesion on the left side as compared to the right.  There seems to be a hyperintensity in the left subarachnoid space of unclear etiology.  This does not seem to correspond to hemorrhage as witnessed on CT scan.  Gradient echo images show scattered bifrontal parietal old hemorrhages which may be related to multiple head injuries that she has had in the past as a result of falls, especially being on nonsteroidal anti-inflammatory medications.  No acute infarcts are present on MRI.  Transthoracic echocardiogram is pending.   PAST MEDICAL HISTORY: 1.  Severe rheumatoid arthritis.  2.  Hypothyroidism.  3.  Gastroesophageal reflux disease.   PAST SURGICAL HISTORY:  Bilateral knee replacement, bilateral hip replacement, left wrist replacement, and interphalangeal joint replacements.   CURRENT MEDICATIONS IN THE HOSPITAL:  1.  Etodolac 500 mg twice daily.  2.  Synthroid 50 mcg  daily.  3.  Nexium 40 mg daily.    At home the patient had been taking methotrexate 7.5 mg q. weekly and Humira 40 mg subcutaneously once a month.  Folic acid was also taken with the methotrexate.   ALLERGIES:  No known drug allergies other than the adverse reactions mentioned in the HPI.   SOCIAL HISTORY:  Denies smoking,  alcohol or illicit drug use.   FAMILY HISTORY:  Negative for rheumatoid arthritis or any inherited blood clotting disorders.   REVIEW OF SYSTEMS:  Reveals no fever, no meningismus, no diplopia.  No dysphagia.  She did have a very mild headache of 2 out of 10 severity, associated in the bifrontal and bilateral retro-orbital areas without any associated nausea, vomiting phonophobia, photophobia or aggravation with movement.  Her headache was a dull aching in nature and not throbbing or pulsating.  There has been no history of shortness of breath, diarrhea, constipation and all other review of systems are negative.   PHYSICAL EXAMINATION: VITAL SIGNS:  Blood pressure is 136/74, pulse of 86, temperature 98.2.  HEART:  Regular rate and rhythm, S1, S2.  No murmurs.  LUNGS:  Clear to auscultation.  NECK:  There are no carotid bruits.  NEUROLOGIC:  She is awake and alert.  Language is fluent.  Comprehension, naming and repetition are intact.  Pupils are equal and reactive.  Extraocular movements are intact.  Face is symmetrical.  Tongue is midline.  Palate raises symmetrically.  Strength is five out of five bilaterally in the upper and lower extremities in all muscle groups taking into account the significant joint deformities which impairs the full test ability.  There is no Babinski sign.  There is no Hoffmann sign.  There are no skin rashes.  Coordination finger-to-nose intact bilaterally.  Sensory testing is intact.  Gait testing was deferred at this time.   IMPRESSION AND PLAN:  This is a patient whose history is suggestive of transient ischemic attack involving the left hemisphere; however, she does not have any vascular risk factors that can be identified as a source of the transient ischemic attacks.  Rheumatoid arthritis can cause central nervous system vasculitis with associated transient ischemic attack or stroke.  This is extremely rare, but possible.  The patient's white matter lesions,  microhemorrhages and potential subarachnoid space inflammation may be suggestive of vasculitis, however Humira has been associated with thromboembolism as a side effect and therefore it might be related to that.  Methotrexate has been associated with white matter lesions which may have been some of the cause of the prior white matter lesions and potentially her newest symptomatology as well.  At this time, I agree with holding the Humira and methotrexate.  I recommend repeating the MRI of the brain in three months to see if the abnormal signal in the left hemisphere, subarachnoid space has resolved.  I will order a cerebral spinal fluid under fluoroscopy given the fact that this will be very difficult at the bedside with her rheumatoid arthritis and prior spine lesions.  I will also order a CT angiogram of the brain to look at any evidence of vasculitis which may have occurred as a result of rheumatoid.  I do recommend restarting the aspirin at 81 mg daily for secondary stroke prevention.     ____________________________ Weston Settle, MD se:ea D: 04/09/2014 14:25:06 ET T: 04/09/2014 15:04:13 ET JOB#: [id number]  cc: Weston Settle, MD, <Dictator> Weston Settle MD ELECTRONICALLY SIGNED 05/14/2014 10:51

## 2015-03-18 NOTE — Discharge Summary (Signed)
PATIENT NAME:  Crystal [name], Crystal [name] MR#:  [id number] DATE OF BIRTH:  1951/05/29  DATE OF ADMISSION:  04/10/2014 DATE OF DISCHARGE:  04/12/2014  DISCHARGE DIAGNOSES:  1. Right hand numbness, likely due to acute left parietal lobe cerebrovascular accident seen on MRI.  2. Acute cerebrovascular accident as seen on MRI of the brain. Symptom almost resolved.   SECONDARY DIAGNOSES:  1. Severe rheumatoid arthritis.  2. Osteoporosis. 3. Gastroesophageal reflux disease. 4. Hypothyroidism.   CONSULTATIONS:  1. Neurology.  2. Rheumatology.  PROCEDURES AND RADIOLOGY:  1. CT scan of the head without contrast on 15th of May showed no evidence of mass or hemorrhage and no acute pathology.  2. Bilateral carotid Dopplers on 15th of May showed no significant plaque or stenosis on the left. Patent vertebral arteries.  3. MRI of the brain without contrast on 16th of May showed no acute infarct.   hyperintensity in the subarachnoid space worrisome for meningitis or subarachnoid hemorrhage can certainly be due to oxygen administration.  4. CT angiogram of the head on 17th of May showed no acute pathology.  5. Unsuccessful fluoroscopy-guided lumbar puncture due to difficulty in patient positioning on 18th of May.  6. MRI of the brain with and without contrast on 19th of May showed tiny acute nonhemorrhagic left parietal lobe infarct. Possible small vessel disease. 7. A 2D echo on 16th of May showed left ventricular ejection fraction of 60% to 65%. impaired relaxation of left ventricular diastolic filling. Normal global left ventricular systolic function.  HISTORY AND SHORT HOSPITAL COURSE: The patient is a 64 year old female with the above-mentioned medical problems who was admitted for right-sided numbness, tingling along with headache. Please see Dr. Hilbert Odor dictated history and physical for further details. The patient underwent extensive neuro workup including neurology consultation, was concern for possible  CNS vasculitis and recommended rheumatology consult which was obtained with Dr. Lavenia Atlas, felt this to be very unlikely from CNS vasculitis, more possibly from TIA/CVA. Repeat MRI with and without contrast was performed on 19th of May which did show nonhemorrhagic acute stroke. The patient's symptoms had almost resolved and she was close to her baseline and was discharged home in stable condition 19th of May.  PHYSICAL EXAMINATION: Pertinent physical examination on the date of discharge:  VITALS SIGNS: On the date of discharge, her vital signs were as follows: Temperature 97.8, heart rate 99 per minute, respirations 20 per minute, blood pressure 122/83 mmHg. She was saturating 94% on room air.  CARDIOVASCULAR: S1, S2 normal. No murmurs, rubs, or gallop.  LUNGS: Clear to auscultation bilaterally. No wheezing, rales, rhonchi, or crepitation.  ABDOMEN: Soft, benign.  NEUROLOGIC: Nonfocal examination.   All other physical examination remained at baseline.   DISCHARGE MEDICATIONS:  1. Vitamin B12 at 1 tablet p.o. daily.  2. Vitamin E 400 international units once daily.  3. Tylenol arthritis 650 mg 2 tablets p.o. b.i.d.  4. Humira  1 dose subcutaneous once a month. 5. Calcium with vitamin D once daily. 6. Colace 1 capsule p.o. daily.  7. Multivitamin with iron 1 daily.  8. Synthroid 50 mcg p.o. daily.  9. Etodolac 500 mg p.o. b.i.d.  10. Methotrexate 2.5 mg 3 tablets p.o. once a week on Tuesdays.  11. Folic acid 1 mg 3 tablets p.o. once a week on Tuesday.  12. Omeprazole 20 mg p.o. daily.  13. Fish oil 1 capsule p.o. daily.  14. Aspirin 81 mg p.o. daily. 15. Simvastatin 20 mg p.o. at bedtime.   DISCHARGE DIET:  Low sodium, low fat, low cholesterol.   DISCHARGE ACTIVITY: As tolerated.   DISCHARGE INSTRUCTIONS AND FOLLOWUP: The patient was instructed to follow up with her primary care physician, Dr. Larwance Sachs at [facility] [place], in 1 to 2 weeks. She will need followup with Dr.  Saverio Danker from rheumatology in 2 to 4 weeks and with [facility] Neurology in 4 to 6 weeks.   TOTAL TIME DISCHARGING THIS PATIENT: 55 minutes.    ____________________________ Ellamae Sia. Sherryll Burger, MD vss:lt/am D: 04/13/2014 04:19:19 ET T: 04/13/2014 04:39:32 ET JOB#: [id number]  cc: Pradyun Ishman S. Sherryll Burger, MD, <Dictator> Kandyce Rud, MD Helen Hashimoto Royann Shivers., MD [facility] Neurology   Ellamae Sia [facility] MD ELECTRONICALLY SIGNED 04/19/2014 11:46

## 2015-03-18 NOTE — Discharge Summary (Signed)
Dates of Admission and Diagnosis:  Date of Admission 13-Apr-2014   Date of Discharge 18-Apr-2014   Admitting Diagnosis possible cerebral vascular accident (stroke)   Final Diagnosis Right sided numbness: intermittent and improved. has minimal on rt face at times.   Discharge Diagnosis 1 Small L parietal infarct- per neuro, incidental finding and not cause of symptoms   2 Probable simple partial seizure: started on Trileptal 300mg  BID x 1 week, then 600mg  BID per neuro    Chief Complaint/History of Present Illness Right-sided numbness and tingling   Allergies:  Gold Containing compounds: SOB  Vitamin B 12: Wheezing, Other  Arava: Other  Daypro: Other  Fosamax: Other  Voltaren: Other  Sulfasalazine: Unknown  Nalfon: Unknown  Demerol: Unknown  Morphine: Unknown  Sulfa: Other  Remicade: Other  Hospital Course:  Condition on Discharge Good   Code Status:  Code Status Full Code   PHYSICAL EXAM ON DISCHARGE:  Physical Exam:  GEN no acute distress   HEENT pink conjunctivae   NECK supple  No masses   RESP normal resp effort  clear BS   CARD regular rate  no murmur   VASCULAR ACCESS -- Purulent drainage   ABD denies tenderness  soft  normal BS   EXTR negative edema   SKIN No ulcers   NEURO follows commands   PSYCH alert, A+O to time, place, person   DISCHARGE INSTRUCTIONS HOME MEDS:  Medication Reconciliation: Patient's Home Medications at Discharge:     Medication Instructions  vitamin b12 500 mcg oral tablet  1 tab(s) orally once a day    vitamin e 400 intl units oral capsule  1 cap(s) orally once a day    tylenol arthritis caplet 650 mg oral tablet  2 tabs (1300mg ) orally 2 times a day    calcium 600+d  1 tab(s) orally once a day   docusate sodium sodium 100 mg oral capsule  1 cap(s) orally once a day   multivitamin with iron  1 tab(s) orally once a day   synthroid 50 mcg (0.05 mg) oral tablet  1 tab(s) orally once a day   fish oil - oral capsule   1 cap(s) orally once a day   omeprazole 20 mg oral delayed release capsule  1 cap(s) orally once a day   aspirin 81 mg oral delayed release tablet  1 tab(s) orally once a day   simvastatin 20 mg oral tablet  1 tab(s) orally once a day (at bedtime)   oxcarbazepine 600 mg oral tablet  1 tab(s) orally 2 times a day start on 04/21/14   oxcarbazepine 300 mg oral tablet  1 tab(s) orally 2 times a day stop it on 04/20/14 followed by 600 mg bid     Physician's Instructions:  Diet Low Sodium  Low Fat, Low Cholesterol   Activity Limitations As tolerated   Return to Work Not Applicable   Time frame for Follow Up Appointment 1-2 days  Dr 04/23/14 [facility] Neuro as scheduled   Time frame for Follow Up Appointment 1-2 weeks  Clydia Llano   Time frame for Follow Up Appointment 2-4 weeks  Sherryll Burger Wallace(Consultant): [facility], [address] [place], [facility] [address] [phone]   [place] E(Family Physician): [phone]  TIME SPENT:  Total Time: Greater than 30 minutes   Electronic Signatures: [phone] (MD)  (Signed 27-May-15 23:07)  Authored: ADMISSION DATE AND DIAGNOSIS, CHIEF COMPLAINT/HPI, Allergies, HOSPITAL  COURSE, PHYSICAL EXAM ON DISCHARGE, DISCHARGE INSTRUCTIONS HOME MEDS, PATIENT INSTRUCTIONS, Follow Up Physician, TIME SPENT   Last Updated: 27-May-15 23:07 by Patricia Pesa (MD)

## 2015-03-18 NOTE — H&P (Signed)
PATIENT NAME:  Crystal [name], RIGDON MR#:  [id number] DATE OF BIRTH:  05-15-1951  DATE OF ADMISSION:  04/08/2014  PRIMARY CARE PHYSICIAN: Dr. Larwance Sachs.  RHEUMATOLOGIST: Dr. Lavenia Atlas.   CHIEF COMPLAINT: Right-sided numbness.   This is a 64 year old female who presented to the Emergency Room today due to right-sided numbness that began earlier today. Her initial symptoms started in the right 2 fingers, the lateral fingers. The numbness then transcended up her arm into the right shoulder and into her right face and also in her right leg. This started about a few hours ago. She also had a headache earlier this morning, and she says that she normally does not get many headaches. She denied any focal weakness, any nausea or any vomiting, any chest pain, shortness of breath, or any other associated symptoms. By the time she came to the Emergency Room, her numbness had started to improve and her headache is pretty much gone. Hospitalist services were contacted for further treatment and evaluation.   REVIEW OF SYSTEMS:    CONSTITUTIONAL: No documented fever. No weight gain. No weight loss.  EYES: No blurred or double vision.  EARS, NOSE, THROAT: No tinnitus. No postnasal drip. No redness of the oropharynx.  RESPIRATORY: No cough, no wheeze, no hemoptysis, no dyspnea.  CARDIOVASCULAR: No chest pain, no orthopnea, no palpitations, no syncope.  GASTROINTESTINAL: No nausea, no vomiting, no diarrhea. No abdominal pain. No melena or hematochezia.  GENITOURINARY: No dysuria, no hematuria.  ENDOCRINE: No polyuria, nocturia, heat or cold intolerance.  HEMATOLOGIC: No anemia, no bruising, no bleeding.  INTEGUMENTARY: No rashes, no lesions.  MUSCULOSKELETAL: Positive severe rheumatoid arthritis. No gout. No redness.  NEUROLOGIC: Positive numbness. No tingling. No ataxia. No seizure type activity.  PSYCHIATRIC: No anxiety, no insomnia, no ADD.   PAST MEDICAL HISTORY: Consistent with severe rheumatoid arthritis,  osteoporosis, GERD, hypothyroidism.   ALLERGIES: MULTIPLE DRUGS INCLUDING ARAVA, DAYPRO, FOSAMAX, REMICADE, VOLTAREN, DEMEROL, SULFA DRUGS, MORPHINE, SULFASALAZINE.   SOCIAL HISTORY: No smoking. No alcohol abuse. No illicit drug abuse. Lives at home with her husband.   FAMILY HISTORY: The patient's father died from a suicide. Mother is in a nursing home, has a history of a previous stroke.   CURRENT MEDICATIONS: Calcium with vitamin D 1 tab daily, Colace 100 mg daily, etodolac 5 mg b.i.d., fish oil 1 tab daily, folic acid 3 mg weekly on Tuesdays along with her methotrexate, Humira monthly, methotrexate 2.5 mg 3 tabs weekly on Tuesdays, multivitamin with iron daily, omeprazole 20 mg daily, Synthroid 50 mcg daily, Tylenol Arthritis 650 mg 2 tabs b.i.d., vitamin B12 at 500 mcg daily, vitamin E 400 international units daily.   PHYSICAL EXAMINATION: Presently is as follows:  VITAL SIGNS: Temperature is 97.9, pulse 94, respirations 18, blood pressure 146/82, sats 100% on room air.  GENERAL: She is a pleasant-appearing female in no apparent distress.  HEAD, EYES, EARS, NOSE, AND THROAT: Atraumatic, normocephalic. Extraocular muscles are intact. Pupils equal and reactive to light. Sclerae anicteric. No conjunctival injection. No pharyngeal erythema.  NECK: Supple. There is no jugular venous distention. No bruits, no lymphadenopathy, no thyromegaly.  HEART: Regular rate and rhythm. No murmurs, no rubs, no clicks.  LUNGS: Clear to auscultation bilaterally. No rales, no rhonchi, no wheezes.  ABDOMEN: Soft, flat, nontender, nondistended. Has good bowel sounds. No hepatosplenomegaly appreciated.  EXTREMITIES: No evidence of cyanosis, clubbing, or peripheral edema. Has +2 pedal and radial pulses bilaterally. She has significant deformity of her upper extremities from her debilitating rheumatoid arthritis.  She also has a cervical fusion done.   NEUROLOGIC: She is alert, awake, oriented x 3. No focal motor  deficits appreciated bilaterally. She does have mild sensorineural loss on her right side, more on the right upper extremity as compared to the right lower extremity. Reflexes are +2, and no other deficits noted.  SKIN: Moist and warm with no rashes.  LYMPHATIC: There is no cervical or axillary lymphadenopathy.   LABORATORY AND RADIOLOGICAL DATA: Serum glucose of 87, BUN 13, creatinine 0.4, sodium 140, potassium 3.8, chloride 103, bicarbonate 30. LFTs within normal limits. Troponin less than 0.02. White cell count 5.1, hemoglobin 12.4, hematocrit 38, platelet count 238. INR is 1.0.   The patient did have a CT scan of the head done without contrast showing mild periventricular small vessel disease. No evidence of acute mass, hemorrhage, or infarct.   ASSESSMENT AND PLAN: This is a 64 year old female with a history of severe rheumatoid arthritis, osteoporosis, gastroesophageal reflux disease, hypothyroidism, who presented to the hospital with right-sided numbness and tingling and also a headache. The symptoms have significantly much improved and are pretty much resolved now.  1.  Transient ischemic attack/cerebrovascular accident: This is the likely diagnosis given her transient neurologic symptoms that have now significantly improved. She has no previous history of stroke. I will start her on baby aspirin for now. Her CT head is negative. I will get an MRI of her brain, echocardiogram, and a carotid duplex. Follow q.4 hour neuro checks.  2.  History of rheumatoid arthritis: Continue with her etodolac and her Tylenol Arthritis. She is also on Humira and methotrexate, but she only takes it weekly.  3.  Gastroesophageal reflux disease: Continue Protonix.  4.  Hypothyroidism: Continue Synthroid.   CODE STATUS: The patient is a full code.   TIME SPENT: 45 minutes.    ____________________________ Rolly Pancake. Cherlynn Kaiser, MD vjs:jcm D: 04/08/2014 15:02:29 ET T: 04/08/2014 17:24:29  ET JOB#: [id number]  cc: Rolly Pancake. Cherlynn Kaiser, MD, <Dictator> [place] Siren MD ELECTRONICALLY SIGNED 04/20/2014 15:06

## 2015-03-18 NOTE — Consult Note (Signed)
Brief Consult Note: Patient was seen by consultant.   Consult note dictated.   Comments: sudden onset of headache, rt body numbness, transient, recur x1. past hx of transient viual loss thought to be TIA . etiologies include1. TIA ? 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 no evidence of isolated CNS vasculitis    unable to do LP  Rec 1 consider rescanning MRI with contrast. if no evidence of hemorrhage, consider rx as TIA  with ASA   2. stop Humira, but may cont methotrexate   3 unless MS like lesions are seen on MRI, then no need for steriods 4 D/c etodolac.  Electronic Signatures: Royann Shivers., Helen Hashimoto (MD)  (Signed 18-May-15 13:44)  Authored: Brief Consult Note   Last Updated: 18-May-15 13:44 by Royann Shivers., Helen Hashimoto (MD)

## 2015-03-18 NOTE — Discharge Summary (Signed)
PATIENT NAME:  Crystal [name], RISO MR#:  [id number] DATE OF BIRTH:  [birth year]  DATE OF ADMISSION:  04/14/2014 DATE OF DISCHARGE:  04/18/2014  DISCHARGE DIAGNOSES: 1. Right-sided numbness, intermittent and now improved. 2. 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 3. 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  SECONDARY DIAGNOSES: 1. Severe rheumatoid arthritis.  2. Osteoporosis. 3. Gastroesophageal reflux disease. 4. Hypothyroidism.   CONSULTATIONS: 1. Neurology, Dr. Mellody Drown. 2. Rheumatology, Dr. Saverio Danker.  PROCEDURES AND RADIOLOGY:  Lumbar puncture by Dr. Mellody Drown. CT scan of the head without contrast on 20th of May showed no acute intracranial finding. MRI of the brain with and without contrast on 21st of May showed second acute lacunar infarct in the posterior left MCA territory without mass effect or associated hemorrhage. Chronic small vessel disease seen. Persistent and mildly increased abnormal FLAIR signal in the left hemisphere, worrisome for subarachnoid space infection. CSF evaluation recommended.  Lumbar puncture performed by Dr. Mellody Drown on 21st of May.  MAJOR LABORATORY PANEL:  UA on admission was negative.  CSF. C-reactive protein was elevated with a value of 58.5.  ANA was within normal limit. ACE enzyme was negative with a value of 18.  HSV-I and II were within normal limit. Lupus anticoagulant antibody was within normal limit.  Anticardiolipin antibody within normal limit.     HISTORY AND SHORT HOSPITAL COURSE: The patient is a 64 year old female with the above-mentioned medical problems, who was readmitted for right-sided numbness and tingling. Please see Dr. Margaretmary Eddy dictated history and physical. The patient was just discharged the day before, and her symptoms recurred, for which she was admitted. On the previous visit, this was thought to be due to possible CVA, although the patient had a similar episode again.  This was thought to be more of partial simple seizure rather than CVA, as per neurology. Dr. Mellody Drown from neurology evaluated the patient and recommended MRI of the brain with and without contrast, which was performed, worrisome for possible CSF disease, for which lumbar puncture was performed by Dr. Mellody Drown. He recommended starting the patient on Trileptal for possible simple partial seizure along with Solu-Medrol, which was done. The patient was feeling significantly better by 25th of May. The symptoms had almost resolved, and she was discharged home on 25th of May in stable condition.   PERTINENT PHYSICAL EXAMINATION ON THE DATE OF DISCHARGE:  VITALS SIGNS: As follows: Temperature 98.1, heart rate 81 per minute, respirations 18 per minute, blood pressure 117/75 mmHg. She was saturating 96% on room air.  CARDIOVASCULAR: S1, S2 normal. No murmurs, rubs or gallop.  LUNGS: Clear to auscultation bilaterally. No wheezing, rales, rhonchi or crepitation.  ABDOMEN: Soft, benign.  NEUROLOGIC: Nonfocal examination.  MUSCULOSKELETAL: She did have a significant deformity from her severe rheumatoid arthritis on the extremities. All other physical examination remained at baseline.   DISCHARGE MEDICATIONS:  1. Vitamin B12 500 mcg p.o. daily.  2. Vitamin E 400 international units once daily.  3. Tylenol Arthritis 650 mg 2 tablets p.o. twice a day. 4. Calcium with vitamin D 1 tablet p.o. daily. 5. Colace 100 mg p.o. daily.  6. Multivitamin with iron once daily.  7. Synthroid 50 mcg p.o. daily.  8. Fish oil once daily. 9. Omeprazole 20 mg p.o. daily.  10. Aspirin 81 mg p.o. daily. 11. Simvastatin 20 mg p.o. at bedtime.  12. Oxcarbazepine/Trileptal 300 mg p.o. b.i.d. for 2 days, to be stopped on 27th of May, followed by 600  mg twice a day for the rest of the course starting 04/21/2014.   DISCHARGE DIET: Low sodium, low fat, low cholesterol.   DISCHARGE ACTIVITY: As tolerated.   DISCHARGE  INSTRUCTIONS AND FOLLOWUP: The patient was instructed to follow up with [facility]rology, Dr. Cristopher Peru, in 1 to 2 days and was scheduled. She was requested to follow up with Dr. Kandyce Rud in 1 to 2 weeks at [facility]. Will need followup with Dr. Saverio Danker in 2 to 4 weeks.   TOTAL TIME DISCHARGING THIS PATIENT: 55 minutes.    ____________________________ Ellamae Sia. Sherryll Burger, MD vss:lb D: 04/22/2014 11:47:11 ET T: 04/22/2014 12:46:09 ET JOB#: [id number]  cc: Jaeleen Inzunza S. Sherryll Burger, MD, <Dictator> Kandyce Rud, MD Hemang K. Sherryll Burger, MD Dessie Coma., MD Troy Sine Katrinka Blazing, MD Ellamae Sia [facility] MD ELECTRONICALLY SIGNED 04/22/2014 13:16

## 2015-06-06 ENCOUNTER — Ambulatory Visit: Payer: Self-pay | Admitting: Obstetrics and Gynecology

## 2015-06-14 ENCOUNTER — Ambulatory Visit: Payer: Self-pay | Admitting: Obstetrics and Gynecology

## 2015-07-12 ENCOUNTER — Encounter: Payer: Self-pay | Admitting: Obstetrics and Gynecology

## 2015-07-12 ENCOUNTER — Ambulatory Visit (INDEPENDENT_AMBULATORY_CARE_PROVIDER_SITE_OTHER): Payer: Medicare Other | Admitting: Obstetrics and Gynecology

## 2015-07-12 VITALS — BP 133/81 | HR 105 | Ht 63.0 in | Wt 136.3 lb

## 2015-07-12 DIAGNOSIS — E041 Nontoxic single thyroid nodule: Secondary | ICD-10-CM | POA: Insufficient documentation

## 2015-07-12 DIAGNOSIS — N8111 Cystocele, midline: Secondary | ICD-10-CM | POA: Insufficient documentation

## 2015-07-12 DIAGNOSIS — R55 Syncope and collapse: Secondary | ICD-10-CM | POA: Insufficient documentation

## 2015-07-12 DIAGNOSIS — M8580 Other specified disorders of bone density and structure, unspecified site: Secondary | ICD-10-CM | POA: Insufficient documentation

## 2015-07-12 DIAGNOSIS — N816 Rectocele: Secondary | ICD-10-CM

## 2015-07-12 DIAGNOSIS — R030 Elevated blood-pressure reading, without diagnosis of hypertension: Secondary | ICD-10-CM | POA: Insufficient documentation

## 2015-07-12 DIAGNOSIS — N952 Postmenopausal atrophic vaginitis: Secondary | ICD-10-CM

## 2015-07-12 DIAGNOSIS — N8110 Cystocele, unspecified: Secondary | ICD-10-CM | POA: Diagnosis not present

## 2015-07-12 DIAGNOSIS — M069 Rheumatoid arthritis, unspecified: Secondary | ICD-10-CM | POA: Insufficient documentation

## 2015-07-12 DIAGNOSIS — IMO0002 Reserved for concepts with insufficient information to code with codable children: Secondary | ICD-10-CM

## 2015-07-12 DIAGNOSIS — G0481 Other encephalitis and encephalomyelitis: Secondary | ICD-10-CM | POA: Insufficient documentation

## 2015-07-12 DIAGNOSIS — J45909 Unspecified asthma, uncomplicated: Secondary | ICD-10-CM | POA: Insufficient documentation

## 2015-07-12 DIAGNOSIS — E538 Deficiency of other specified B group vitamins: Secondary | ICD-10-CM | POA: Insufficient documentation

## 2015-07-12 NOTE — Patient Instructions (Signed)
1.  Return in 1 year for follow-up on cystocele and rectocele. 2.  May hold off on using vaginal estrogen at this time.

## 2015-07-12 NOTE — Progress Notes (Signed)
Chief complaint: 1.  Follow-up on pelvic organ prolapse.  The patient is a 64 year old white female status post anterior posterior colporrhaphy with enterocele ligation proximally 6 years ago, who presents for follow-up on pelvic organ prolapse.  Bowel function is normal.  Bladder function is normal without episodes of leaking.  She does not have any pressure symptomatology. The patient does not use estrogen vaginal cream as previously prescribed. Patient is very pleased with lack of symptomatology to date.  OBJECTIVE: BP 133/81 mmHg  Pulse 105  Ht 5\' 3"  (1.6 m)  Wt 136 lb 5 oz (61.831 kg)  BMI 24.15 kg/m2 Patient is a pleasant, well-appearing female in no acute distress.  She is alert and oriented. Abdomen is soft and nontender. Pelvic exam:  External genitalia: Normal  BUS: Normal.  Vagina: First to second degree cystocele present with good support at the UVJ.  No significant rectocele.  No enterocele.  Vaginal mucosa has fair estrogen effect.   Cervix: Surgically absent  Uterus: Surgically absent  Rectovaginal: Normal.  External exam; normal sphincter tone,; no rectal masses    ASSESSMENT: 1.  Status post anterior posterior colporrhaphy with enterocele ligation, 6 years ago. 2.  Second-degree cystocele, asymptomatic. 3.  Mild vaginal atrophy.  PLAN: 1.  Return in 1 year for follow-up or when necessary if pelvic pressure symptoms or incontinence.  Develops 2.  Patient may use Estrace vaginal cream once or twice a week as desired.  A total of 15 minutes were spent face-to-face with the patient during this encounter and over half of that time dealt with counseling and coordination of care.  , MD

## 2016-03-04 ENCOUNTER — Other Ambulatory Visit: Payer: Self-pay | Admitting: Family Medicine

## 2016-03-04 DIAGNOSIS — Z1231 Encounter for screening mammogram for malignant neoplasm of breast: Secondary | ICD-10-CM

## 2016-03-13 ENCOUNTER — Other Ambulatory Visit: Payer: Self-pay | Admitting: Family Medicine

## 2016-03-13 ENCOUNTER — Ambulatory Visit
Admission: RE | Admit: 2016-03-13 | Discharge: 2016-03-13 | Disposition: A | Discharge status: Home / Self Care | Payer: Medicare Other | Source: Ambulatory Visit | Attending: Family Medicine | Admitting: Family Medicine

## 2016-03-13 DIAGNOSIS — Z1231 Encounter for screening mammogram for malignant neoplasm of breast: Secondary | ICD-10-CM

## 2016-04-18 ENCOUNTER — Ambulatory Visit: Payer: Medicare Other

## 2016-04-18 ENCOUNTER — Encounter: Payer: Self-pay | Admitting: *Deleted

## 2016-04-18 ENCOUNTER — Ambulatory Visit
Admission: EM | Admit: 2016-04-18 | Discharge: 2016-04-18 | Disposition: A | Discharge status: Home / Self Care | Payer: Medicare Other | Attending: Family Medicine | Admitting: Family Medicine

## 2016-04-18 DIAGNOSIS — Z96662 Presence of left artificial ankle joint: Secondary | ICD-10-CM | POA: Insufficient documentation

## 2016-04-18 DIAGNOSIS — Z79899 Other long term (current) drug therapy: Secondary | ICD-10-CM | POA: Diagnosis not present

## 2016-04-18 DIAGNOSIS — M25572 Pain in left ankle and joints of left foot: Secondary | ICD-10-CM | POA: Insufficient documentation

## 2016-04-18 DIAGNOSIS — Z7982 Long term (current) use of aspirin: Secondary | ICD-10-CM | POA: Diagnosis not present

## 2016-04-18 DIAGNOSIS — M79672 Pain in left foot: Secondary | ICD-10-CM

## 2016-04-18 DIAGNOSIS — Z90710 Acquired absence of both cervix and uterus: Secondary | ICD-10-CM | POA: Insufficient documentation

## 2016-04-18 DIAGNOSIS — M069 Rheumatoid arthritis, unspecified: Secondary | ICD-10-CM | POA: Diagnosis not present

## 2016-04-18 NOTE — Discharge Instructions (Signed)
Cryotherapy  Cryotherapy is when you put ice on your injury. Ice helps lessen pain and puffiness (swelling) after an injury. Ice works the best when you start using it in the first 24 to 48 hours after an injury.  HOME CARE  · Put a dry or damp towel between the ice pack and your skin.  · You may press gently on the ice pack.  · Leave the ice on for no more than 10 to 20 minutes at a time.  · Check your skin after 5 minutes to make sure your skin is okay.  · Rest at least 20 minutes between ice pack uses.  · Stop using ice when your skin loses feeling (numbness).  · Do not use ice on someone who cannot tell you when it hurts. This includes small children and people with memory problems (dementia).  GET HELP RIGHT AWAY IF:  · You have white spots on your skin.  · Your skin turns blue or pale.  · Your skin feels waxy or hard.  · Your puffiness gets worse.  MAKE SURE YOU:   · Understand these instructions.  · Will watch your condition.  · Will get help right away if you are not doing well or get worse.     This information is not intended to replace advice given to you by your health care provider. Make sure you discuss any questions you have with your health care provider.     Document Released: 04/29/2008 Document Revised: 02/03/2012 Document Reviewed: 07/04/2011  Elsevier Interactive Patient Education ©2016 [employer]

## 2016-04-18 NOTE — ED Provider Notes (Addendum)
CSN: [id number]     Arrival date & time 04/18/16  1323 History   First MD Initiated Contact with Patient 04/18/16 1423    Nurses notes were reviewed. Chief Complaint  Patient presents with  . Ankle Pain    Patient with left ankle pain. Started about 14 days ago. She did trip over her cane and developed some acute foot pain about for 5 days ago but the main pain in the foot is been going on for almost 2 weeks. She had fusion of the left ankle about 13 years ago female developed a stress fracture just when she walked and pressure she is placed on that foot. She states the most pain is on the side of the left foot by the distal metacarpal bone and more over the the medial aspect of the left heel. She denies any significant trauma before the pain started. She tries to get into orthopedics this started he would be mid June for they can get her into be seen. Past medical history she has rheumatoid arthritis she's had some GYN issues and surgeries as well for cystocele and rectocele also. She has had abdominal hysterectomy as well. No pertinent family medical history regarding this visit and she does not never smoked.   (Consider location/radiation/quality/duration/timing/severity/associated sxs/prior Treatment) Patient is a 65 y.o. female presenting with ankle pain. The history is provided by the patient. No language interpreter was used.  Ankle Pain Location:  Ankle and foot Injury: no   Ankle location:  L ankle Foot location:  L foot Pain details:    Quality:  Shooting, throbbing and sharp   Radiates to:  Does not radiate   Severity:  Moderate   Onset quality:  Sudden   Duration:  10 days   Timing:  Constant   Progression:  Worsening Chronicity:  New Foreign body present:  No foreign bodies Prior injury to area:  No Relieved by:  Nothing Ineffective treatments: She did have an air splint when she had surgery 1314 years ago and she's been wearing back brace doesn't help much. Risk factors: no  concern for non-accidental trauma and no known bone disorder   Risk factors comment:   prominent rheumatoid arthritis    Past Medical History  Diagnosis Date  . Vaginal atrophy   . Cystocele     2nd degree  . Rectocele     mild  . Enterocele     early  . Rheumatoid arthritis [facility])    Past Surgical History  Procedure Laterality Date  . Total hip arthroplasty Bilateral [id number]  . Hand surgery  2002    7 joint implants  . Foot surgery Bilateral 1993  . Knee replacement Bilateral 1993  . Neck fusion  2000  . Thyroid surgery  1999  . Shoulder arthroscopy  2001  . Anterior and posterior vaginal repair  2009    enterocele ligation  . Abdominal hysterectomy  1980  . Carpal tunnel release Left     1982  . Wrist surgery Left 1986  . Temporomandibular joint arthroplasty  1991   Family History  Problem Relation Age of Onset  . Breast cancer Maternal Aunt     2 maternal aunts 12,61  . Breast cancer Paternal Aunt 65  . Breast cancer Cousin     3 maternal cousins, <50   Social History  Substance Use Topics  . Smoking status: Never Smoker   . Smokeless tobacco: Never Used  . Alcohol Use: No   OB History  Gravida Para Term Preterm AB TAB SAB Ectopic Multiple Living   2 2 2       2      Review of Systems  All other systems reviewed and are negative.   Allergies  B12-c-fa-fe; Alendronate sodium; Demerol; Diclofenac; Humira; Hydrocodone-acetaminophen; Ibandronic acid; Morphine and related; Nalfon; Sulfasalazine; Oxaprozin; and Oxcarbazepine  Home Medications   Prior to Admission medications   Medication Sig Start Date End Date Taking? Authorizing Provider  aspirin EC 81 MG tablet Take by mouth.   Yes Historical Provider, MD  Calcium-Vitamin D (CALTRATE 600 PLUS-VIT D PO) Take by mouth.   Yes Historical Provider, MD  Cyanocobalamin (RA VITAMIN B-12 TR) 1000 MCG TBCR Take by mouth.   Yes Historical Provider, MD  docusate sodium (STOOL SOFTENER) 100 MG capsule Take by  mouth.   Yes Historical Provider, MD  etodolac (LODINE) 500 MG tablet TAKE ONE (1) TABLET BY MOUTH TWO (2) TIMES DAILY 01/17/15  Yes Historical Provider, MD  Golimumab (SIMPONI) 50 MG/0.5ML SOAJ Inject into the skin every 30 (thirty) days.   Yes Historical Provider, MD  Multiple Vitamin (MULTI-VITAMINS) TABS Take by mouth.   Yes Historical Provider, MD  Omega-3 Fatty Acids (FISH OIL) 1000 MG CAPS Take by mouth.   Yes Historical Provider, MD  omeprazole (PRILOSEC) 20 MG capsule Take by mouth. 11/07/14  Yes Historical Provider, MD  predniSONE (DELTASONE) 5 MG tablet Take by mouth. 02/21/15  Yes Historical Provider, MD  vitamin E 400 UNIT capsule Take by mouth.   Yes Historical Provider, MD  albuterol (PROAIR HFA) 108 (90 BASE) MCG/ACT inhaler Inhale into the lungs. 08/12/14   Historical Provider, MD  promethazine (PHENERGAN) 25 MG tablet 1/2-1 TAB Q 6-8 HRS PRN N/V 06/15/15   Historical Provider, MD  traMADol 06/17/15) 50 MG tablet Take by mouth. 05/15/15   Historical Provider, MD   Meds Ordered and Administered this Visit  Medications - No data to display  BP 114/68 mmHg  Pulse 111  Temp(Src) 98.4 F (36.9 C) (Oral)  Resp 20  Ht 5\' 3"  (1.6 m)  Wt 126 lb (57.153 kg)  BMI 22.33 kg/m2  SpO2 97% No data found.   Physical Exam  Constitutional: She is oriented to person, place, and time. She appears well-developed and well-nourished.  HENT:  Head: Normocephalic.  Eyes: Pupils are equal, round, and reactive to light.  Musculoskeletal: Normal range of motion. She exhibits tenderness.       Left foot: There is tenderness and bony tenderness.       Feet:  Patient does have marked rheumatoid arthritis of the hands and other extremities  Neurological: She is alert and oriented to person, place, and time.  Skin: Skin is warm.  Psychiatric: She has a normal mood and affect.  Vitals reviewed.   ED Course  Procedures (including critical care time)  Labs Review Labs Reviewed - No data to  display  Imaging Review Dg Ankle Complete Left  04/18/2016  CLINICAL DATA:  Left ankle pain for 10 days after falling. History of rheumatoid arthritis with ankle arthrodesis. Initial encounter. EXAM: LEFT ANKLE COMPLETE - 3+ VIEW COMPARISON:  None. FINDINGS: The bones are severely demineralized. Patient is status post ankle arthrodesis and surgical subtalar arthrodesis. There is extensive ankylosis throughout the foot. No evidence of acute fracture or dislocation. No focal soft tissue abnormalities. IMPRESSION: No acute osseous findings. Postsurgical changes as described with extensive ankylosis throughout the foot. Electronically Signed   By: .D.  On: 04/18/2016 15:28   Dg Foot Complete Left  04/18/2016  CLINICAL DATA:  Left ankle pain for 10 days after falling. History of rheumatoid arthritis with ankle arthrodesis. Initial encounter. EXAM: LEFT FOOT - COMPLETE 3+ VIEW COMPARISON:  None. FINDINGS: The bones are severely demineralized. There is extensive ankylosis throughout the foot. There are pencil in cup erosions at the fifth MTP joint. No evidence of acute fracture or dislocation. Status post left ankle arthroplasty without hardware displacement. IMPRESSION: No acute osseous findings demonstrated. Osteopenia with multiple joint ankylosis. Electronically Signed   By: Carey Bullocks M.D.   On: 04/18/2016 15:26     Visual Acuity Review  Right Eye Distance:   Left Eye Distance:   Bilateral Distance:    Right Eye Near:   Left Eye Near:    Bilateral Near:         MDM   1. Left foot pain   2. Ankle joint replacement status, left      Patient will be fitted for a orthopedic walking boot to give the left foot better stability than the air splint that she is using now have her follow-up with the [facility] orthopedic did her ankle fusion when she is able to get an appointment with him. Return when necessary as needed.  Patient was also sent to leave with the walking boot  for the left foot but then found her gait was too unstable with a walking boot so she is side 90 units lives the option of a walking boot even though it did make the foot feel better according to her.  Note: This dictation was prepared with Dragon dictation along with smaller phrase technology. Any transcriptional errors that result from this process are unintentional.  Hassan Rowan, MD 04/18/16 1559  Hassan Rowan, MD 04/18/16 [phone]

## 2016-04-18 NOTE — ED Notes (Signed)
Left ankle pain x10 days, pt tripped over her cane Sunday a week ago but pain began approx May 14 and pain became worse after her trip. No edema or deformity. Hx of RA and has ankle replacement on that leg. Her orthopedic surgeon can't see her for another month and pain is getting worse.

## 2016-07-11 ENCOUNTER — Ambulatory Visit: Payer: Medicare Other | Admitting: Obstetrics and Gynecology

## 2016-07-17 ENCOUNTER — Ambulatory Visit (INDEPENDENT_AMBULATORY_CARE_PROVIDER_SITE_OTHER): Payer: Medicare Other | Admitting: Obstetrics and Gynecology

## 2016-07-17 ENCOUNTER — Encounter: Payer: Self-pay | Admitting: Obstetrics and Gynecology

## 2016-07-17 VITALS — BP 109/68 | HR 84 | Ht 63.0 in | Wt 128.6 lb

## 2016-07-17 DIAGNOSIS — N8110 Cystocele, unspecified: Secondary | ICD-10-CM | POA: Diagnosis not present

## 2016-07-17 DIAGNOSIS — N952 Postmenopausal atrophic vaginitis: Secondary | ICD-10-CM

## 2016-07-17 DIAGNOSIS — IMO0002 Reserved for concepts with insufficient information to code with codable children: Secondary | ICD-10-CM

## 2016-07-17 NOTE — Progress Notes (Signed)
Chief complaint: 1.  Follow-up on pelvic organ prolapse.  The patient is a 65 year old white female status post anterior posterior colporrhaphy with enterocele ligation approximally 7 years ago, who presents for follow-up on pelvic organ prolapse.  Bowel function is normal.  Bladder function is normal without episodes of leaking.  She does not have any pressure symptomatology. The patient does not use estrogen vaginal cream as previously prescribed. Patient is very pleased with lack of symptomatology to date.  OBJECTIVE: BP 109/68   Pulse 84   Ht 5\' 3"  (1.6 m)   Wt 128 lb 9.6 oz (58.3 kg)   BMI 22.78 kg/m  Patient is a pleasant, well-appearing female in no acute distress.  She is alert and oriented. Abdomen is soft and nontender. Pelvic exam:                       External genitalia: Normal                       BUS: Normal.                       Vagina: Second degree cystocele present with good support at the UVJ.  No significant rectocele.  No                       enterocele.  Vaginal mucosa has fair estrogen effect.                                            Cervix: Surgically absent                       Uterus: Surgically absent                       Rectovaginal: Normal.  External exam; normal sphincter tone,; no rectal masses                                     ASSESSMENT: 1.  Status post anterior posterior colporrhaphy with enterocele ligation, 7 years ago. 2.  Second-degree cystocele, asymptomatic. 3.  Mild vaginal atrophy.  PLAN: 1.  Return in 1 year for follow-up or when necessary if pelvic pressure symptoms or incontinence develops 2.  Patient may use Estrace vaginal cream once or twice a week as desired  A total of 15 minutes were spent face-to-face with the patient during this encounter and over half of that time dealt with counseling and coordination of care.  , MD  Note: This dictation was prepared with Dragon dictation along with smaller  phrase technology. Any transcriptional errors that result from this process are unintentional.

## 2016-07-17 NOTE — Patient Instructions (Signed)
1. Return in 1 year for follow up

## 2017-03-19 ENCOUNTER — Other Ambulatory Visit: Payer: Self-pay | Admitting: Family Medicine

## 2017-03-19 DIAGNOSIS — Z1231 Encounter for screening mammogram for malignant neoplasm of breast: Secondary | ICD-10-CM

## 2017-04-09 ENCOUNTER — Ambulatory Visit
Admission: RE | Admit: 2017-04-09 | Discharge: 2017-04-09 | Disposition: A | Discharge status: Home / Self Care | Payer: Medicare Other | Source: Ambulatory Visit | Attending: Family Medicine | Admitting: Family Medicine

## 2017-04-09 DIAGNOSIS — Z1231 Encounter for screening mammogram for malignant neoplasm of breast: Secondary | ICD-10-CM | POA: Diagnosis present

## 2017-07-23 ENCOUNTER — Encounter: Payer: Self-pay | Admitting: Obstetrics and Gynecology

## 2017-07-23 ENCOUNTER — Ambulatory Visit (INDEPENDENT_AMBULATORY_CARE_PROVIDER_SITE_OTHER): Payer: Medicare Other | Admitting: Obstetrics and Gynecology

## 2017-07-23 VITALS — BP 122/78 | HR 96 | Ht 63.0 in | Wt 123.8 lb

## 2017-07-23 DIAGNOSIS — N8111 Cystocele, midline: Secondary | ICD-10-CM

## 2017-07-23 DIAGNOSIS — N952 Postmenopausal atrophic vaginitis: Secondary | ICD-10-CM | POA: Diagnosis not present

## 2017-07-23 NOTE — Progress Notes (Signed)
Obstetrics/Gynecology     Chief complaint: 1.  Pelvic organ prolapse.  The patient is a 66 year old white female status post anterior posterior colporrhaphy with enterocele ligation approximally 8 years ago, who presents for follow-up on pelvic organ prolapse. Bowel function is normal. Bladder function is normal without episodes of leaking. She does not have any pressure symptomatology. The patient does not use estrogen vaginal cream as previously prescribed. Patient is very pleased with lack of symptomatology to date. No major changes have been noted over the past year.  OBJECTIVE: BP 122/78   Pulse 96   Ht 5\' 3"  (1.6 m)   Wt 123 lb 12.8 oz (56.2 kg)   BMI 21.93 kg/m  Patient is a pleasant, well-appearing female in no acute distress. She is alert and oriented. Abdomen is soft and nontender. Pelvic exam: External genitalia: Normal BUS: Normal. Vagina: Second degree cystocele present with good support at the UVJ. No significant rectocele. No enterocele. Vaginal mucosa has fair estrogen effect. Cervix: Surgically absent Uterus: Surgically absent Rectovaginal: Normal. External exam; normal sphincter tone,; no rectal masses  ASSESSMENT: 1. Status post anterior posterior colporrhaphy with enterocele ligation, 8 years ago. 2. Second-degree cystocele, asymptomatic. 3. Mild vaginal atrophy.  PLAN: 1. Return in 1 year for follow-up or when necessary if pelvic pressure symptoms or incontinence develops 2. Patient may use Estrace vaginal cream once or twice a week as desired  A total of 15 minutes were spent face-to-face with the patient during this encounter and over half of that time dealt with counseling and coordination of care.  , MD

## 2017-07-23 NOTE — Patient Instructions (Signed)
1.  Return in 1 year for follow-up on cystocele and vaginal atrophy

## 2017-10-10 ENCOUNTER — Other Ambulatory Visit: Payer: Self-pay | Admitting: Rheumatology

## 2017-10-10 DIAGNOSIS — M898X8 Other specified disorders of bone, other site: Secondary | ICD-10-CM

## 2017-10-10 DIAGNOSIS — R0789 Other chest pain: Secondary | ICD-10-CM

## 2017-10-17 ENCOUNTER — Ambulatory Visit
Admission: RE | Admit: 2017-10-17 | Discharge: 2017-10-17 | Disposition: A | Discharge status: Home / Self Care | Payer: Medicare Other | Source: Ambulatory Visit | Attending: Rheumatology | Admitting: Rheumatology

## 2017-10-17 DIAGNOSIS — M89522 Osteolysis, left upper arm: Secondary | ICD-10-CM | POA: Diagnosis not present

## 2017-10-17 DIAGNOSIS — M25411 Effusion, right shoulder: Secondary | ICD-10-CM | POA: Insufficient documentation

## 2017-10-17 DIAGNOSIS — R0789 Other chest pain: Secondary | ICD-10-CM | POA: Insufficient documentation

## 2017-10-17 DIAGNOSIS — M899 Disorder of bone, unspecified: Secondary | ICD-10-CM | POA: Insufficient documentation

## 2017-10-17 DIAGNOSIS — M89521 Osteolysis, right upper arm: Secondary | ICD-10-CM | POA: Insufficient documentation

## 2017-10-17 DIAGNOSIS — M25412 Effusion, left shoulder: Secondary | ICD-10-CM | POA: Insufficient documentation

## 2017-10-17 DIAGNOSIS — I700 Atherosclerosis of aorta: Secondary | ICD-10-CM | POA: Insufficient documentation

## 2017-10-17 DIAGNOSIS — M898X8 Other specified disorders of bone, other site: Secondary | ICD-10-CM

## 2017-10-23 ENCOUNTER — Ambulatory Visit: Payer: Medicare Other | Admitting: Occupational Therapy

## 2017-11-03 ENCOUNTER — Ambulatory Visit: Payer: Medicare Other | Admitting: Occupational Therapy

## 2017-11-11 ENCOUNTER — Encounter: Payer: Self-pay | Admitting: Occupational Therapy

## 2017-11-11 ENCOUNTER — Ambulatory Visit: Payer: Medicare Other | Attending: Rheumatology | Admitting: Occupational Therapy

## 2017-11-11 DIAGNOSIS — M6281 Muscle weakness (generalized): Secondary | ICD-10-CM | POA: Diagnosis present

## 2017-11-11 DIAGNOSIS — M25642 Stiffness of left hand, not elsewhere classified: Secondary | ICD-10-CM | POA: Diagnosis present

## 2017-11-11 DIAGNOSIS — M25641 Stiffness of right hand, not elsewhere classified: Secondary | ICD-10-CM | POA: Insufficient documentation

## 2017-11-11 NOTE — Therapy (Signed)
[facility] [address], [place], [address] Phone: [phone]   Fax:  [phone]  Occupational Therapy Evaluation  Patient Details  Name: Crystal [name] MRN: [id number] Date of Birth: 1951/09/17 No Data Recorded  Encounter Date: 11/11/2017  OT End of Session - 11/11/17 2048    Visit Number  1    Number of Visits  4    Date for OT Re-Evaluation  12/09/17    OT Start Time  1331    OT Stop Time  1433    OT Time Calculation (min)  62 min    Activity Tolerance  Patient tolerated treatment well    Behavior During Therapy  [facility] for tasks assessed/performed       Past Medical History:  Diagnosis Date  . Cystocele    2nd degree  . Enterocele    early  . Rectocele    mild  . Rheumatoid arthritis (HCC)   . Vaginal atrophy     Past Surgical History:  Procedure Laterality Date  . ABDOMINAL HYSTERECTOMY  1980  . ANTERIOR AND POSTERIOR VAGINAL REPAIR  2009   enterocele ligation  . CARPAL TUNNEL RELEASE Left    1982  . ELBOW SURGERY    . FOOT SURGERY Bilateral 1993  . hand surgery  2002   7 joint implants  . knee replacement Bilateral 1993  . neck fusion  2000  . SHOULDER ARTHROSCOPY  2001  . TEMPOROMANDIBULAR JOINT ARTHROPLASTY  1991  . THYROID SURGERY  1999  . TOTAL HIP ARTHROPLASTY Bilateral [id number]  . WRIST SURGERY Left 1986    There were no vitals filed for this visit.  Subjective Assessment - 11/11/17 1723    Subjective   I had for so long RA and had several surgeries for bilateral hands, wrist , elbow - and could always pick up , write , dress using pinch grip - but now lately I cannot pinch anymore with my thumb and index - thought hand therapy can maybe help     Patient Stated Goals  I need my pinch grip with my thumb and index to pull up pants, write , cooking, pick up something small     Currently in Pain?  No/denies        [facility] OT Assessment - 11/11/17 0001       Assessment   Medical Diagnosis  RA multiple joints - in hands     Referring Provider  [employer]     Onset Date/Surgical Date  04/25/17    Hand Dominance  Left      Home  Environment   Lives With  Spouse      Prior Function   Vocation  On disability    Leisure  likes to cook , visit family , volunteer at church , read ,       Strength   Right Hand Lateral Pinch  3.5 lbs modified    Left Hand Lateral Pinch  2 lbs modified      Right Hand AROM   R Index  MCP 0-90  66 Degrees    R Index PIP 0-100  25 Degrees    R Long  MCP 0-90  65 Degrees    R Long PIP 0-100  15 Degrees    R Long DIP 0-70  90 Degrees    R Ring  MCP 0-90  65 Degrees    R Ring PIP 0-100  15 Degrees  R Ring DIP 0-70  55 Degrees    R Little  MCP 0-90  60 Degrees    R Little PIP 0-100  0 Degrees      Left Hand AROM   L Index  MCP 0-90  85 Degrees    L Long  MCP 0-90  65 Degrees    L Ring  MCP 0-90  75 Degrees    L Little  MCP 0-90  75 Degrees       Paraffin done - and done PROM and AROM place and hold on MC of R hand   show increase AROM ability and increase strength pinching into putty   HEP review   Moist heat  PROM for MC flexion  Place and hold MC flexion  All R hand   Light blue putty for pinch - between thumb and 2nd digit  then thumb and 3 point  And ball of putty in palm and gripping   10 reps each   Opposition pick up 1 cm object with thumb and all digits alternating  2-3 x day                  OT Education - 11/11/17 2047    Education provided  Yes    Education Details  findings and HEP    Person(s) Educated  Patient    Methods  Explanation;Demonstration;Tactile cues;Verbal cues;Handout    Comprehension  Verbalized understanding;Returned demonstration;Verbal cues required          OT Long Term Goals - 11/11/17 2054      OT LONG TERM GOAL #1   Title  Pt be ind in HEP to increase ability to do 2 and/or 3 point  pinch ,and increase MC flexion  in R hand to pick up  small object or pull up pants     Baseline  Cannot get 2nd and 3rd digit on R hand to touch thumb anymore - only 5th     Time  3    Period  Weeks    Status  New    Target Date  12/02/17      OT LONG TERM GOAL #2   Title  Pt to show increase in lat/3 point pinch in R and L by 1-2 lbs to report little more ease with dressing pants , bra- and in cooking and writing     Baseline  2 lbs for L lat grip , and 3,5 lbs on R for lat grip     Time  4    Period  Weeks    Status  New    Target Date  12/09/17            Plan - 11/11/17 2049    Clinical Impression Statement  Pt refer to OT with RA diagnosis - pt been diagnose since age 17 - and joint replacement in R elbow , R digits joints , and multiple other wrist , hand and elbow surgeries- pt had issues the last year or 2 tolerating meds -and while inbetween two - she lost some of her hand function and ability to pinch  with R hand  - pt show fusions in PIP's but appear that she can gain some MC flexion - to get her to be able to use 2 point pinch in R hand - lat grip on L - pt do show severe ulnar deviation out of L hand PIP's - ? dislocation - cannot use even oval 8 splints- provided HEP  for pt - will reassess  progress in about 10 days     Occupational performance deficits (Please refer to evaluation for details):  ADL's;IADL's;Play;Leisure    Rehab Potential  Fair    Current Impairments/barriers affecting progress:  Chronic RA changes in hands and all joints-     OT Frequency  1x / week    OT Duration  4 weeks    OT Treatment/Interventions  Self-care/ADL training;Moist Heat;Paraffin;Therapeutic exercise;Passive range of motion;Patient/family education    Plan  assess progress with HEP     Clinical Decision Making  Multiple treatment options, significant modification of task necessary    OT Home Exercise Plan  see pt instruction     Consulted and Agree with Plan of Care  Patient       Patient will benefit from skilled therapeutic  intervention in order to improve the following deficits and impairments:  Impaired flexibility, Decreased knowledge of use of DME, Decreased coordination, Decreased strength, Decreased range of motion, Impaired UE functional use  Visit Diagnosis: Stiffness of right hand, not elsewhere classified - Plan: Ot plan of care cert/re-cert  Stiffness of left hand, not elsewhere classified - Plan: Ot plan of care cert/re-cert  Muscle weakness (generalized) - Plan: Ot plan of care cert/re-cert  G-Codes - 2017-12-06 Apr 10, 2057    Functional Assessment Tool Used (Outpatient only)  ROM , grip and prehension , joint assessment - clinical judgement    Functional Limitation  Self care    Self Care Current Status (U5427)  At least 60 percent but less than 80 percent impaired, limited or restricted    Self Care Goal Status (C6237)  At least 40 percent but less than 60 percent impaired, limited or restricted       Problem List Patient Active Problem List   Diagnosis Date Noted  . 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 B12 deficiency 07/12/2015  . Borderline blood pressure 07/12/2015  . Bladder cystocele 07/12/2015  . Osteopenia 07/12/2015  . Arthritis or polyarthritis, rheumatoid (HCC) 07/12/2015  . Near syncope 07/12/2015  . Thyroid nodule 07/12/2015  . History of melena 09/20/2014  . Pain in rectum 09/20/2014  . Adult hypothyroidism 08/12/2014  . History of implantation of joint prosthesis of elbow 05/11/2014  . Ankle pain 04/10/12  . Rheumatoid arthritis of foot (HCC) Apr 10, 2012  . History of artificial joint 03/13/2012    Oletta Cohn OTR/L,CLT 2017/12/06, 9:02 PM  [facility]YS[facility] SPORTS MEDICINE Apr 10, [address], [place], [address] Phone: [phone]   Fax:  [phone]  Name: Crystal [name] MRN: [id number] Date of Birth: September 14, 1951

## 2017-11-11 NOTE — Patient Instructions (Signed)
Moist heat  PROM for MC flexion  Place and hold MC flexion  All R hand   Light blue putty for pinch - between thumb and 2nd digit  then thumb and 3 point  And ball of putty in palm and gripping   10 reps each   Opposition pick up 1 cm object with thumb and all digits alternating  2-3 x day

## 2017-11-21 ENCOUNTER — Ambulatory Visit: Payer: Medicare Other | Admitting: Occupational Therapy

## 2017-11-21 DIAGNOSIS — M25642 Stiffness of left hand, not elsewhere classified: Secondary | ICD-10-CM

## 2017-11-21 DIAGNOSIS — M25641 Stiffness of right hand, not elsewhere classified: Secondary | ICD-10-CM | POA: Diagnosis not present

## 2017-11-21 DIAGNOSIS — M6281 Muscle weakness (generalized): Secondary | ICD-10-CM

## 2017-11-21 NOTE — Patient Instructions (Signed)
Same - 3 point pinch to focus - 3rd stay align better if working with 2nd for pinch

## 2017-11-21 NOTE — Therapy (Signed)
[facility] [address], [place], [address] Phone: [phone]   Fax:  [phone]  Occupational Therapy Treatment  Patient Details  Name: Crystal [name] MRN: [id number] Date of Birth: [birth year] No Data Recorded  Encounter Date: 11/21/2017  OT End of Session - 11/21/17 0955    Number of Visits  4    Date for OT Re-Evaluation  12/09/17    OT Start Time  0906    OT Stop Time  0942    OT Time Calculation (min)  36 min    Activity Tolerance  Patient tolerated treatment well    Behavior During Therapy  [facility] for tasks assessed/performed       Past Medical History:  Diagnosis Date  . Cystocele    2nd degree  . Enterocele    early  . Rectocele    mild  . Rheumatoid arthritis (HCC)   . Vaginal atrophy     Past Surgical History:  Procedure Laterality Date  . ABDOMINAL HYSTERECTOMY  1980  . ANTERIOR AND POSTERIOR VAGINAL REPAIR  2009   enterocele ligation  . CARPAL TUNNEL RELEASE Left    1982  . ELBOW SURGERY    . FOOT SURGERY Bilateral 1993  . hand surgery  2002   7 joint implants  . knee replacement Bilateral 1993  . neck fusion  2000  . SHOULDER ARTHROSCOPY  2001  . TEMPOROMANDIBULAR JOINT ARTHROPLASTY  1991  . THYROID SURGERY  1999  . TOTAL HIP ARTHROPLASTY Bilateral [id number]  . WRIST SURGERY Left 1986    There were no vitals filed for this visit.  Subjective Assessment - 11/21/17 0952    Subjective   I don't know if I showed any progress- do not feel like that- did the exercises and soaked my hand in hot water - that helped some     Patient Stated Goals  I need my pinch grip with my thumb and index to pull up pants, write , cooking, pick up something small     Currently in Pain?  No/denies         [facility] OT Assessment - 11/21/17 0001      Strength   Right Hand Lateral Pinch  -- 4-5 after heat - and first time - fatigue    Left Hand Lateral Pinch  -- 2 1/2 lbs -       Right Hand AROM   R Index  MCP 0-90  68 Degrees    R Long  MCP 0-90  70 Degrees    R Ring  MCP 0-90  62 Degrees    R Little  MCP 0-90  62 Degrees           assess ROM , and pinch grip  grip strength R 8lbs, L 6 lbs     OT Treatments/Exercises (OP) - 11/21/17 0001      RUE Paraffin   Number Minutes Paraffin  10 Minutes    RUE Paraffin Location  Hand    Comments  prior to ROM and grip strength with putty       LUE Paraffin   Number Minutes Paraffin  10 Minutes    LUE Paraffin Location  Hand    Comments  at [facility] prior to ROM ,  putty ther ex        Paraffin done - and done PROM and AROM place and hold on MC of R hand   show increase AROM ability  and increase strength pinching into putty   HEP review again    PROM for MC flexion  Place and hold MC flexion  All R hand   Light blue putty for pinch - between thumb and 2nd digit- pulling with L lat grip   then thumb and 3 point - 3rd flex better together with 2nd - if alone for 2 point - deviate away from thumb  And ball of putty in palm and gripping   10 reps each   Opposition pick up 1 cm object with thumb and all digits alternating  Easy after paraffin - otherwise trouble with 3rd digit  2-3 x day         OT Education - 11/21/17 0955    Education Details  progress and HEP     Person(s) Educated  Patient    Methods  Explanation;Demonstration;Tactile cues;Verbal cues    Comprehension  Verbalized understanding;Returned demonstration;Verbal cues required          OT Long Term Goals - 11/11/17 2054      OT LONG TERM GOAL #1   Title  Pt be ind in HEP to increase ability to do 2 and/or 3 point  pinch ,and increase MC flexion  in R hand to pick up small object or pull up pants     Baseline  Cannot get 2nd and 3rd digit on R hand to touch thumb anymore - only 5th     Time  3    Period  Weeks    Status  New    Target Date  12/02/17      OT LONG TERM GOAL #2   Title  Pt to show increase in lat/3 point  pinch in R and L by 1-2 lbs to report little more ease with dressing pants , bra- and in cooking and writing     Baseline  2 lbs for L lat grip , and 3,5 lbs on R for lat grip     Time  4    Period  Weeks    Status  New    Target Date  12/09/17            Plan - 11/21/17 0956    Clinical Impression Statement  Pt showed 2-5 degrees increase at R MC of 2nd , 3rd and 5th - after heat and first time - pt show increase pinch of 1 lbs on R , 1/2 in L - but then fatigue - to cont with same HEP     Occupational performance deficits (Please refer to evaluation for details):  ADL's;IADL's;Play;Leisure    Rehab Potential  Fair    Current Impairments/barriers affecting progress:  Chronic RA changes in hands and all joints-     OT Frequency  Biweekly    OT Duration  2 weeks    OT Treatment/Interventions  Self-care/ADL training;Moist Heat;Paraffin;Therapeutic exercise;Passive range of motion;Patient/family education    Plan  assess progress with HEP     Clinical Decision Making  Multiple treatment options, significant modification of task necessary    OT Home Exercise Plan  see pt instruction     Consulted and Agree with Plan of Care  Patient       Patient will benefit from skilled therapeutic intervention in order to improve the following deficits and impairments:  Impaired flexibility, Decreased knowledge of use of DME, Decreased coordination, Decreased strength, Decreased range of motion, Impaired UE functional use  Visit Diagnosis: Stiffness of right hand, not elsewhere classified  Stiffness of left hand, not elsewhere classified  Muscle weakness (generalized)    Problem List Patient Active Problem List   Diagnosis Date Noted  . 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 B12 deficiency 07/12/2015  . Borderline blood pressure 07/12/2015  . Bladder cystocele 07/12/2015  . Osteopenia 07/12/2015  . Arthritis or polyarthritis, rheumatoid (HCC)  07/12/2015  . Near syncope 07/12/2015  . Thyroid nodule 07/12/2015  . History of melena 09/20/2014  . Pain in rectum 09/20/2014  . Adult hypothyroidism 08/12/2014  . History of implantation of joint prosthesis of elbow 05/11/2014  . Ankle pain 03/16/2012  . Rheumatoid arthritis of foot (HCC) 03/16/2012  . History of artificial joint 03/13/2012    Oletta Cohn OTR/L,CLT 11/21/2017, 10:00 AM  [facility] [address], [place], [address] Phone: [phone]   Fax:  [phone]  Name: Crystal [name] MRN: [id number] Date of Birth: 1951/07/11

## 2017-12-11 ENCOUNTER — Ambulatory Visit: Payer: Medicare Other | Attending: Rheumatology | Admitting: Occupational Therapy

## 2017-12-11 DIAGNOSIS — M6281 Muscle weakness (generalized): Secondary | ICD-10-CM | POA: Insufficient documentation

## 2017-12-11 DIAGNOSIS — M25641 Stiffness of right hand, not elsewhere classified: Secondary | ICD-10-CM

## 2017-12-11 DIAGNOSIS — M25642 Stiffness of left hand, not elsewhere classified: Secondary | ICD-10-CM | POA: Insufficient documentation

## 2017-12-11 NOTE — Therapy (Signed)
[facility] [address] [place], [address] Phone: [phone]   Fax:  [phone]  Occupational Therapy Treatment  Patient Details  Name: Crystal [name] MRN: [id number] Date of Birth: 1951-07-24 Referring Provider: Gavin Potters    Encounter Date: 12/11/2017  OT End of Session - 12/11/17 1002    Visit Number  3    Number of Visits  3    Date for OT Re-Evaluation  12/11/17    OT Start Time  0916    OT Stop Time  0945    OT Time Calculation (min)  29 min    Activity Tolerance  Patient tolerated treatment well    Behavior During Therapy  [facility] for tasks assessed/performed       Past Medical History:  Diagnosis Date  . Cystocele    2nd degree  . Enterocele    early  . Rectocele    mild  . Rheumatoid arthritis (HCC)   . Vaginal atrophy     Past Surgical History:  Procedure Laterality Date  . ABDOMINAL HYSTERECTOMY  1980  . ANTERIOR AND POSTERIOR VAGINAL REPAIR  2009   enterocele ligation  . CARPAL TUNNEL RELEASE Left    1982  . ELBOW SURGERY    . FOOT SURGERY Bilateral 1993  . hand surgery  2002   7 joint implants  . knee replacement Bilateral 1993  . neck fusion  2000  . SHOULDER ARTHROSCOPY  2001  . TEMPOROMANDIBULAR JOINT ARTHROPLASTY  1991  . THYROID SURGERY  1999  . TOTAL HIP ARTHROPLASTY Bilateral [id number]  . WRIST SURGERY Left 1986    There were no vitals filed for this visit.  Subjective Assessment - 12/11/17 0959    Subjective   My fingers are about the same- did this am warm water and exercises - after I do it - I can move better the R thumb and index together - I compensate a lot - can write , electric can opener and jar opener - and  pull up pants uisng my pockets , fasten bra in front ,  my husband tie shoes in am - cannot put on my compression socks     Patient Stated Goals  I need my pinch grip with my thumb and index to pull up pants, write , cooking, pick up something  small     Currently in Pain?  No/denies         [facility] OT Assessment - 12/11/17 0001      Strength   Right Hand Lateral Pinch  3 lbs    Left Hand Lateral Pinch  2 lbs      Right Hand AROM   R Index  MCP 0-90  68 Degrees    R Long  MCP 0-90  70 Degrees    R Ring  MCP 0-90  65 Degrees    R Little  MCP 0-90  62 Degrees        assess AROM for R hand - and prehension strength  See flowsheet-  No progress   PROM for MC flexion R more than L  Place and hold MC flexion  All R hand   Pt to cont with Light blue putty for pinch - between thumb and 2nd digit- pulling with L lat grip  then thumb and 3 point - 3rd flex better together with 2nd - if alone for 2 point - deviate away from thumb  And ball of putty in  palm and gripping  10 reps each   Opposition pick up 1 cm object with thumb and all digits alternating  Easy after paraffin - otherwise trouble with 3rd digit  2-3 x day  Pt already compensation with ADL's and IADL's - husband helps as needed                  OT Education - 12/11/17 1002    Education provided  Yes    Education Details  discharge HEP     Person(s) Educated  Patient    Methods  Explanation;Demonstration;Tactile cues;Verbal cues    Comprehension  Returned demonstration          OT Long Term Goals - 12/11/17 1030      OT LONG TERM GOAL #1   Title  Pt be ind in HEP to increase ability to do 2 and/or 3 point  pinch ,and increase MC flexion  in R hand to pick up small object or pull up pants     Baseline  Cannot get 2nd and 3rd digit on R hand to touch thumb anymore - only 5th - did improve 2-5 degrees at 2nd and 3rd MC flexion     Status  Achieved      OT LONG TERM GOAL #2   Title  Pt to show increase in lat/3 point pinch in R and L by 1-2 lbs to report little more ease with dressing pants , bra- and in cooking and writing     Baseline  2 lbs for L lat grip , and 3,5 lbs on R for lat grip - same     Status  Achieved             Plan - 12/11/17 1028    Clinical Impression Statement  Pt was seen for eval and 2 sessions - gained only 2-5 degrees of R 2nd and 3rd MC flexion - after moist heat - need to cont wit HEP - but pinch still 3 and 2 lbs in R and L but rely a lot on lat grip and uisng middle phalanges of thumb - IP's cannot resist  and had joint replacement in R thumb IP - pt  is great in compensation and modifying act and using AE to be more independent - pt to cont with HEP for R hand - has potention to gain 10 more degrees at 2nd and 3rd [facility] - pt discharge at this time     Plan  cont with HEP for moist heat and ROM at home     OT Home Exercise Plan  see pt instruction     Consulted and Agree with Plan of Care  Patient       Patient will benefit from skilled therapeutic intervention in order to improve the following deficits and impairments:     Visit Diagnosis: Stiffness of right hand, not elsewhere classified  Stiffness of left hand, not elsewhere classified  Muscle weakness (generalized)    Problem List Patient Active Problem List   Diagnosis Date Noted  . 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 B12 deficiency 07/12/2015  . Borderline blood pressure 07/12/2015  . Bladder cystocele 07/12/2015  . Osteopenia 07/12/2015  . Arthritis or polyarthritis, rheumatoid (HCC) 07/12/2015  . Near syncope 07/12/2015  . Thyroid nodule 07/12/2015  . History of melena 09/20/2014  . Pain in rectum 09/20/2014  . Adult hypothyroidism 08/12/2014  . History of implantation of joint prosthesis of elbow 05/11/2014  .  Ankle pain 03/16/2012  . Rheumatoid arthritis of foot (HCC) 03/16/2012  . History of artificial joint 03/13/2012    Oletta Cohn OTR/L,CLT 12/11/2017, 8:36 PM  [facility] PHYSICAL AND SPORTS MEDICINE [address] [place], [address] Phone: [phone]   Fax:  [phone]  Name: Crystal [name] MRN: [id number] Date of Birth: 02-Jul-1951

## 2017-12-11 NOTE — Patient Instructions (Signed)
Cont with same HEP for moist heat  PROM to MC flexion on R digits   putty light blue for lat grip  Pick up small object with R thumb and index and middle finger

## 2018-04-03 ENCOUNTER — Other Ambulatory Visit: Payer: Self-pay | Admitting: Family Medicine

## 2018-04-03 DIAGNOSIS — Z1231 Encounter for screening mammogram for malignant neoplasm of breast: Secondary | ICD-10-CM

## 2018-04-17 ENCOUNTER — Ambulatory Visit
Admission: RE | Admit: 2018-04-17 | Discharge: 2018-04-17 | Disposition: A | Discharge status: Home / Self Care | Payer: Medicare Other | Source: Ambulatory Visit | Attending: Family Medicine | Admitting: Family Medicine

## 2018-04-17 DIAGNOSIS — Z1231 Encounter for screening mammogram for malignant neoplasm of breast: Secondary | ICD-10-CM | POA: Diagnosis present

## 2018-04-24 ENCOUNTER — Other Ambulatory Visit: Payer: Self-pay | Admitting: Rheumatology

## 2018-04-24 DIAGNOSIS — M3322 Polymyositis with myopathy: Secondary | ICD-10-CM

## 2018-04-24 DIAGNOSIS — R109 Unspecified abdominal pain: Secondary | ICD-10-CM

## 2018-05-01 ENCOUNTER — Ambulatory Visit
Admission: RE | Admit: 2018-05-01 | Discharge: 2018-05-01 | Disposition: A | Discharge status: Home / Self Care | Payer: Medicare Other | Source: Ambulatory Visit | Attending: Rheumatology | Admitting: Rheumatology

## 2018-05-01 DIAGNOSIS — M3322 Polymyositis with myopathy: Secondary | ICD-10-CM | POA: Diagnosis not present

## 2018-05-01 DIAGNOSIS — R933 Abnormal findings on diagnostic imaging of other parts of digestive tract: Secondary | ICD-10-CM | POA: Insufficient documentation

## 2018-05-01 DIAGNOSIS — R109 Unspecified abdominal pain: Secondary | ICD-10-CM | POA: Insufficient documentation

## 2018-05-01 DIAGNOSIS — I700 Atherosclerosis of aorta: Secondary | ICD-10-CM | POA: Insufficient documentation

## 2018-05-01 DIAGNOSIS — K8020 Calculus of gallbladder without cholecystitis without obstruction: Secondary | ICD-10-CM | POA: Diagnosis not present

## 2018-05-01 DIAGNOSIS — Z96643 Presence of artificial hip joint, bilateral: Secondary | ICD-10-CM | POA: Diagnosis not present

## 2018-05-01 HISTORY — DX: Unspecified asthma, uncomplicated: J45.909

## 2018-05-01 HISTORY — DX: Systemic involvement of connective tissue, unspecified: M35.9

## 2018-05-01 LAB — POCT I-STAT CREATININE: Creatinine, Ser: 0.4 mg/dL — ABNORMAL LOW (ref 0.44–1.00)

## 2018-05-01 MED ORDER — IOPAMIDOL (ISOVUE-300) INJECTION 61%
85.0000 mL | Freq: Once | INTRAVENOUS | Status: AC | PRN
  Administered 2018-05-01: 85 mL via INTRAVENOUS

## 2018-05-07 ENCOUNTER — Other Ambulatory Visit: Payer: Self-pay | Admitting: Rheumatology

## 2018-05-07 DIAGNOSIS — R935 Abnormal findings on diagnostic imaging of other abdominal regions, including retroperitoneum: Secondary | ICD-10-CM

## 2018-05-12 ENCOUNTER — Ambulatory Visit
Admission: RE | Admit: 2018-05-12 | Discharge: 2018-05-12 | Disposition: A | Discharge status: Home / Self Care | Payer: Medicare Other | Source: Ambulatory Visit | Attending: Rheumatology | Admitting: Rheumatology

## 2018-05-12 DIAGNOSIS — R935 Abnormal findings on diagnostic imaging of other abdominal regions, including retroperitoneum: Secondary | ICD-10-CM

## 2018-07-23 ENCOUNTER — Encounter: Payer: Self-pay | Admitting: Obstetrics and Gynecology

## 2018-07-23 ENCOUNTER — Ambulatory Visit (INDEPENDENT_AMBULATORY_CARE_PROVIDER_SITE_OTHER): Payer: Medicare Other | Admitting: Obstetrics and Gynecology

## 2018-07-23 VITALS — BP 110/74 | HR 85 | Ht 63.0 in | Wt 120.9 lb

## 2018-07-23 DIAGNOSIS — N952 Postmenopausal atrophic vaginitis: Secondary | ICD-10-CM

## 2018-07-23 DIAGNOSIS — N8111 Cystocele, midline: Secondary | ICD-10-CM | POA: Diagnosis not present

## 2018-07-23 DIAGNOSIS — Z803 Family history of malignant neoplasm of breast: Secondary | ICD-10-CM

## 2018-07-23 NOTE — Progress Notes (Signed)
Chief complaint: 1.  Pelvic organ prolapse 2.  Genetic testing questions  Crystal [name] is a 67 year old female para 2-0-0-2, menopausal, not on a ERT therapy, discontinued Estrace cream this past year, status post anterior/posterior colporrhaphy with enterocele ligation approximately 10 years ago, presents for follow-up on pelvic organ prolapse.  She has had recurrence of a second-degree cystocele which has remained asymptomatic. Bladder function is normal with complete emptying and she is not experiencing any stress incontinence symptomatology.  He does not have to wear a pad. Bowel function is normal.  Additional concern today is the fact that there is an extensive family history of breast cancer in her family and she has been requested to get genetic testing. Maternal grandmother had breast cancer and four maternal aunts all had breast cancer with transmission of bracket gene to offspring. The patient's mother did not develop breast cancer, nor has the patient or her two female offspring. The patient does have a paternal grandfather who had prostate cancer.  There is no family history of melanoma or pancreatic cancer.  Past female history, past surgical history, problem list, medications, and allergies are reviewed  OBJECTIVE: BP 110/74   Pulse 85   Ht 5\' 3"  (1.6 m)   Wt 120 lb 14.4 oz (54.8 kg)   BMI 21.42 kg/m  Pleasant well-appearing female in no acute distress.  Alert and oriented.  Affect is appropriate. Abdomen is soft and nontender without organomegaly. Pelvic exam: External genitalia-normal BUS-normal Vagina-fair estrogen effect; second-degree cystocele is unchanged; there is good support at the urethrovesical junction.  No significant rectocele.  No enterocele. Cervix-surgically absent Uterus-surgically absent Rectovaginal-normal external exam; sphincter tone is normal; no rectal masses  ASSESSMENT: 1.  Second-degree cystocele, asymptomatic; status post anterior/posterior  colporrhaphy with enterocele ligation approximately 10 years ago 2.  Minimal vaginal atrophy; no longer using estrogen cream 3.  Family history of breast cancer  PLAN: 1.  Return in 1 year for follow-up 2.  Return if pelvic organ prolapse symptoms develop 3.  Discontinue estrogen cream 4.  Genetic testing for colon ovary and breast cancers is ordered.  Literature is given.  A total of 15 minutes were spent face-to-face with the patient during this encounter and over half of that time dealt with counseling and coordination of care.  , MD  Note: This dictation was prepared with Dragon dictation along with smaller phrase technology. Any transcriptional errors that result from this process are unintentional.

## 2018-07-23 NOTE — Patient Instructions (Addendum)
1.  Return in 1 year for follow-up on cystocele 2.  Estrogen cream intravaginal is stopped 3.  Genetic testing is ordered-[facility]

## 2018-08-10 ENCOUNTER — Encounter: Payer: Self-pay | Admitting: Obstetrics and Gynecology

## 2018-11-03 ENCOUNTER — Other Ambulatory Visit: Payer: Self-pay | Admitting: Nurse Practitioner

## 2018-11-03 DIAGNOSIS — R1013 Epigastric pain: Secondary | ICD-10-CM

## 2018-11-12 ENCOUNTER — Ambulatory Visit
Admission: RE | Admit: 2018-11-12 | Discharge: 2018-11-12 | Disposition: A | Discharge status: Home / Self Care | Payer: Medicare Other | Source: Ambulatory Visit | Attending: Nurse Practitioner | Admitting: Nurse Practitioner

## 2018-11-12 DIAGNOSIS — R1013 Epigastric pain: Secondary | ICD-10-CM

## 2018-11-19 ENCOUNTER — Ambulatory Visit
Admission: RE | Admit: 2018-11-19 | Discharge: 2018-11-19 | Disposition: A | Discharge status: Home / Self Care | Payer: Medicare Other | Source: Ambulatory Visit | Attending: Nurse Practitioner | Admitting: Nurse Practitioner

## 2018-11-19 DIAGNOSIS — K8020 Calculus of gallbladder without cholecystitis without obstruction: Secondary | ICD-10-CM | POA: Insufficient documentation

## 2018-11-19 DIAGNOSIS — R1013 Epigastric pain: Secondary | ICD-10-CM | POA: Diagnosis present

## 2018-11-25 DIAGNOSIS — K8020 Calculus of gallbladder without cholecystitis without obstruction: Secondary | ICD-10-CM

## 2018-11-25 HISTORY — DX: Calculus of gallbladder without cholecystitis without obstruction: K80.20

## 2019-03-14 ENCOUNTER — Emergency Department
Admission: EM | Admit: 2019-03-14 | Discharge: 2019-03-14 | Disposition: A | Discharge status: Home / Self Care | Payer: Medicare Other | Attending: Emergency Medicine | Admitting: Emergency Medicine

## 2019-03-14 ENCOUNTER — Other Ambulatory Visit: Payer: Self-pay

## 2019-03-14 ENCOUNTER — Emergency Department: Payer: Medicare Other

## 2019-03-14 ENCOUNTER — Encounter: Payer: Self-pay | Admitting: Emergency Medicine

## 2019-03-14 DIAGNOSIS — W19XXXA Unspecified fall, initial encounter: Secondary | ICD-10-CM

## 2019-03-14 DIAGNOSIS — Y998 Other external cause status: Secondary | ICD-10-CM | POA: Diagnosis not present

## 2019-03-14 DIAGNOSIS — Y92000 Kitchen of unspecified non-institutional (private) residence as  the place of occurrence of the external cause: Secondary | ICD-10-CM | POA: Insufficient documentation

## 2019-03-14 DIAGNOSIS — Y9301 Activity, walking, marching and hiking: Secondary | ICD-10-CM | POA: Diagnosis not present

## 2019-03-14 DIAGNOSIS — W010XXA Fall on same level from slipping, tripping and stumbling without subsequent striking against object, initial encounter: Secondary | ICD-10-CM | POA: Insufficient documentation

## 2019-03-14 DIAGNOSIS — Z79899 Other long term (current) drug therapy: Secondary | ICD-10-CM | POA: Insufficient documentation

## 2019-03-14 DIAGNOSIS — J45909 Unspecified asthma, uncomplicated: Secondary | ICD-10-CM | POA: Insufficient documentation

## 2019-03-14 DIAGNOSIS — S79911A Unspecified injury of right hip, initial encounter: Secondary | ICD-10-CM | POA: Diagnosis not present

## 2019-03-14 DIAGNOSIS — M25551 Pain in right hip: Secondary | ICD-10-CM

## 2019-03-14 MED ORDER — TRAMADOL HCL 50 MG PO TABS
50.0000 mg | ORAL_TABLET | ORAL | Status: AC
  Administered 2019-03-14: 50 mg via ORAL
  Filled 2019-03-14: qty 1

## 2019-03-14 MED ORDER — ACETAMINOPHEN 500 MG PO TABS
1000.0000 mg | ORAL_TABLET | ORAL | Status: AC
  Administered 2019-03-14: 1000 mg via ORAL
  Filled 2019-03-14: qty 2

## 2019-03-14 NOTE — ED Notes (Signed)
Attempted to walk pt to bedside toliet with Dora Sims, Char[employer] fundraiser. Pt needed x2 assist to stand and walk. Pt reports she is doing much worse than she normally does at home. Pt reports more pain when in sitting position to standing. Pt reports not having a lot of sensation in her right leg while walking.

## 2019-03-14 NOTE — ED Provider Notes (Signed)
[facility] Emergency Department Provider Note   ____________________________________________   First MD Initiated Contact with Patient 03/14/19 1125     (approximate)  I have reviewed the triage vital signs and the nursing notes.   HISTORY  Chief Complaint Fall    HPI Crystal [name] is a 68 y.o. female to history of arthritis, vascular disease, polymyalgia.  Patient reports that today she was at home, she tripped at the edge of the kitchen.  She fell onto her right hip.  She is having significant pain whenever she tries to move just at the right hip.  She has had a previous hip replacement and revision.  She denies any neck pain.  She did bump her head on the refrigerator as she fell but did not lose consciousness, and she reports it was not hard enough it that she felt as though she was in a pass out or anything.  She is not having a headache.  She takes no blood thinners.  No nausea vomiting.  Denies any other injury, but reports all of her pain is in her right hip.  She normally takes tramadol for pain, but presently does not wish for any as she is not in pain when at rest but only when she tries to move it hurts right over the right hip, she points at the trochanteric region of the right hip.  Denies any other injury.   Past Medical History:  Diagnosis Date  . Asthma   . Collagen vascular disease (HCC)   . Cystocele    2nd degree  . Enterocele    early  . Muscle inflammation   . Rectocele    mild  . Rheumatoid arthritis (HCC)   . Vaginal atrophy     Patient Active Problem List   Diagnosis Date Noted  . Family history of breast cancer 07/23/2018  . 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 B12 deficiency 07/12/2015  . Borderline blood pressure 07/12/2015  . Midline cystocele 07/12/2015  . Osteopenia 07/12/2015  . Arthritis or polyarthritis, rheumatoid (HCC) 07/12/2015  . Near syncope  07/12/2015  . Thyroid nodule 07/12/2015  . History of melena 09/20/2014  . Pain in rectum 09/20/2014  . Adult hypothyroidism 08/12/2014  . History of implantation of joint prosthesis of elbow 05/11/2014  . Ankle pain 03/16/2012  . Rheumatoid arthritis of foot (HCC) 03/16/2012  . History of artificial joint 03/13/2012    Past Surgical History:  Procedure Laterality Date  . ABDOMINAL HYSTERECTOMY  1980  . ANTERIOR AND POSTERIOR VAGINAL REPAIR  2009   enterocele ligation  . CARPAL TUNNEL RELEASE Left    1982  . ELBOW SURGERY    . FOOT SURGERY Bilateral 1993  . hand surgery  2002   7 joint implants  . knee replacement Bilateral 1993  . neck fusion  2000  . SHOULDER ARTHROSCOPY  2001  . TEMPOROMANDIBULAR JOINT ARTHROPLASTY  1991  . THYROID SURGERY  1999  . TOTAL HIP ARTHROPLASTY Bilateral [id number]  . WRIST SURGERY Left 1986    Prior to Admission medications   Medication Sig Start Date End Date Taking? Authorizing Provider  albuterol (PROAIR HFA) 108 (90 BASE) MCG/ACT inhaler Inhale into the lungs. 08/12/14   [provider]  aspirin EC 81 MG tablet Take by mouth.    [provider]  Calcium-Vitamin D (CALTRATE 600 PLUS-VIT D PO) Take by mouth.    [provider]  Cyanocobalamin (RA VITAMIN B-12 TR)  1000 MCG TBCR Take by mouth.    [provider]  etanercept (ENBREL) 25 MG injection Inject 25 mg into the skin.    [provider]  etodolac (LODINE) 500 MG tablet TAKE ONE (1) TABLET BY MOUTH TWO (2) TIMES DAILY 01/17/15   [provider]  ferrous sulfate 325 (65 FE) MG tablet Take 325 mg by mouth daily with breakfast.    [provider]  levothyroxine (SYNTHROID, LEVOTHROID) 88 MCG tablet Take by mouth.    [provider]  methotrexate (RHEUMATREX) 2.5 MG tablet Take by mouth. 06/03/16   [provider]  Multiple Vitamin (MULTI-VITAMINS) TABS Take by mouth.    [provider]  Multiple  Vitamins-Minerals (PRESERVISION AREDS 2 PO) Take by mouth.    [provider]  Omega-3 Fatty Acids (FISH OIL) 1000 MG CAPS Take by mouth.    [provider]  omeprazole (PRILOSEC) 20 MG capsule Take by mouth. 11/07/14   [provider]  predniSONE (DELTASONE) 5 MG tablet Take 2.5 mg by mouth.  02/21/15   [provider]  promethazine (PHENERGAN) 25 MG tablet 1/2-1 TAB Q 6-8 HRS PRN N/V 06/15/15   [provider]  traMADol (ULTRAM) 50 MG tablet TAKE 1 TBALET BY MOUTH TWICE A DAY AS NEEDED FOR PAIN 07/17/17   [provider]  vitamin E 400 UNIT capsule Take by mouth.    [provider]    Allergies B12-c-fa-fe [iron-vit c-vit b12-folic acid]; Sulfa antibiotics; Vitamin b12; Alendronate sodium; Demerol [meperidine]; Diclofenac; Humira [adalimumab]; Hydrocodone-acetaminophen; Ibandronic acid; Morphine and related; Nalfon [fenoprofen calcium]; Remicade [infliximab]; Sulfasalazine; Imuran [azathioprine]; Oxaprozin; and Oxcarbazepine  Family History  Problem Relation Age of Onset  . Breast cancer Maternal Aunt        32  . Breast cancer Paternal Aunt 41  . Diabetes Paternal Aunt   . Breast cancer Cousin        3 maternal cousins, <50  . Colon cancer Cousin   . Diabetes Mother   . Diabetes Brother   . Breast cancer Maternal Aunt 61  . Ovarian cancer Neg Hx     Social History Social History   Tobacco Use  . Smoking status: Never Smoker  . Smokeless tobacco: Never Used  Substance Use Topics  . Alcohol use: No    Alcohol/week: 0.0 standard drinks  . Drug use: No    Review of Systems Constitutional: No fever/chills or recent illness Eyes: No visual changes. ENT: No sore throat. Cardiovascular: Denies chest pain. Respiratory: Denies shortness of breath. Gastrointestinal: No abdominal pain.   Genitourinary: Negative for dysuria. Musculoskeletal: Negative for back pain.  No injury to the neck or back.  No pain or injury  anywhere else other than the area of the right hip that is tender when she moves it.  No pain at rest.  She has difficulty wiggling her toes and her fingers because of severe rheumatoid.  She also wears a boot over the left ankle on a regular basis.  No injury to the left leg left arm or right arm. Skin: Negative for rash. Neurological: Negative for headaches, areas of focal weakness or numbness.    ____________________________________________   PHYSICAL EXAM:  VITAL SIGNS: ED Triage Vitals  Enc Vitals Group     BP 03/14/19 1116 (!) 152/82     Pulse Rate 03/14/19 1116 83     Resp 03/14/19 1116 16     Temp 03/14/19 1116 (!) 97.5 F (36.4 C)  Temp Source 03/14/19 1116 Oral     SpO2 03/14/19 1116 100 %     Weight 03/14/19 1115 120 lb (54.4 kg)     Height 03/14/19 1115 5\' 3"  (1.6 m)     Head Circumference --      Peak Flow --      Pain Score 03/14/19 1115 10     Pain Loc --      Pain Edu? --      Excl. in GC? --     Constitutional: Alert and oriented. Well appearing and in no acute distress. Eyes: Conjunctivae are normal. Head: Atraumatic.  No cervical tenderness.  Full range of motion the neck without pain or discomfort. Nose: No congestion/rhinnorhea. Mouth/Throat: Mucous membranes are moist. Neck: No stridor.  Cardiovascular: Normal rate, regular rhythm. Grossly normal heart sounds.  Good peripheral circulation. Respiratory: Normal respiratory effort.  No retractions. Lungs CTAB. Gastrointestinal: Soft and nontender. No distention. Musculoskeletal:   RIGHT Right upper extremity demonstrates normal strength, good use of all muscles. No edema bruising or contusions of the right shoulder/upper arm, right elbow, right forearm / hand. Full range of motion of the right right upper extremity without pain. No evidence of trauma. Strong radial pulse. Intact median/ulnar/radial neuro-muscular exam.  LEFT Left upper extremity demonstrates normal strength, good use of all muscles.  No edema bruising or contusions of the left shoulder/upper arm, left elbow, left forearm / hand. Full range of motion of the left  upper extremity without pain. No evidence of trauma. Strong radial pulse. Intact median/ulnar/radial neuro-muscular exam.  Of note, the patient's joints especially the fingers and toes are riddled with arthritic findings.  Lower Extremities  No edema. Normal DP/PT pulses bilateral with good cap refill.  Normal neuro-motor function lower extremities bilateral.  RIGHT Right lower extremity demonstrates normal strength, good use of all muscles except for limitation of flexion extension of the right hip due to pain that she reports of the right trochanteric region. No edema bruising or contusions of the right knee, right ankle.  There is no shortening or rotation.  No pain or discomfort over the knee, lower leg, tib-fib, foot or ankle.Marland Kitchen No pain on axial loading. No evidence of trauma.  LEFT Left lower extremity demonstrates normal strength, good use of all muscles.  Boot on the left foot is taken off, no injury underneath.  No edema bruising or contusions of the hip,  knee, ankle. Full range of motion of the left lower extremity without pain. No pain on axial loading. No evidence of trauma.  There is no tenderness across the bony pelvis.  Neurologic:  Normal speech and language. No gross focal neurologic deficits are appreciated.  Skin:  Skin is warm, dry and intact. No rash noted. Psychiatric: Mood and affect are normal. Speech and behavior are normal.  ____________________________________________   LABS (all labs ordered are listed, but only abnormal results are displayed)  Labs Reviewed - No data to display ____________________________________________  EKG   ____________________________________________  RADIOLOGY  Ct Hip Right Wo Contrast  Result Date: 03/14/2019 CLINICAL DATA:  Status post fall, right hip pain, unable to bear weight EXAM: CT OF THE  RIGHT HIP WITHOUT CONTRAST TECHNIQUE: Multidetector CT imaging of the right hip was performed according to the standard protocol. Multiplanar CT image reconstructions were also generated. COMPARISON:  None. FINDINGS: Bones/Joint/Cartilage Generalized osteopenia. Right total hip arthroplasty. Beam hardening artifact partially obscures adjacent soft tissue and osseous structures. No acute fracture or dislocation. No aggressive osseous lesion.  No hardware failure or complication. Ligaments Suboptimally assessed by CT. Muscles and Tendons Muscles are normal.  No muscle atrophy. Soft tissues No fluid collection or hematoma. No soft tissue mass. Postsurgical changes in the soft tissues overlying the right greater trochanter. IMPRESSION: 1. Right total hip arthroplasty. Beam hardening artifact partially obscures adjacent soft tissue and osseous structures. No evidence of acute osseous injury of the right hip. Electronically Signed   By: Elige KoHetal  Patel   On: 03/14/2019 13:14   Dg Hip Unilat W Or Wo Pelvis 2-3 Views Right  Result Date: 03/14/2019 CLINICAL DATA:  Pain after fall. EXAM: DG HIP (WITH OR WITHOUT PELVIS) 2-3V RIGHT COMPARISON:  None. FINDINGS: The patient is status post bilateral hip replacements. No evidence of hardware failure. No dislocation. No right hip fracture identified. Diffuse osteopenia. IMPRESSION: No fracture or dislocation. No hardware failure. Diffuse osteopenia. Electronically Signed   By: Gerome Samavid  Williams III M.D   On: 03/14/2019 12:17   Imaging studies reviewed and discussed with Dr. Martha ClanKrasinski of orthopedics ____________________________________________   PROCEDURES  Procedure(s) performed: None  Procedures  Critical Care performed: No  ____________________________________________   INITIAL IMPRESSION / ASSESSMENT AND PLAN / ED COURSE  Pertinent labs & imaging results that were available during my care of the patient were reviewed by me and considered in my medical decision  making (see chart for details).   Patient had a mechanical fall onto her right hip.  She is having notable discomfort over the trochanteric region but there is no shortening or rotation.  No other injury is notable.  She does report that she struck her head on the refrigerator as she fell onto her right hip, but does not take any anticoagulants, she does not have a headache, she has no signs or symptoms of trauma to the head, and denies symptoms of head injury.  She appears low risk for intracranial injury.  Majority of the fall was onto the right hip.  There is no frank evidence of a hip fracture dislocation by clinical exam, but she is painful focally over the trochanteric region.  Question if this could be a contusion versus fracture versus other musculoskeletal etiology.  No back or pelvic discomfort.  No pain below the right trochanteric region  ----------------------------------------- 1:57 PM on 03/14/2019 -----------------------------------------  After tramadol the patient is able to ambulate with about 6 out of 10 pain in the right hip region.  Reviewed her imaging studies, and discussed with her and offered physical therapy evaluation and further evaluation and pain control in the ER.  However the patient reports she is very familiar with joint pain having had multiple orthopedic surgeries in the past.  She would like to be able to go home, use her tramadol and Tylenol which she uses for pain at home.  She has a walker, cane, and assistance at home.  She is comfortable with the plan for discharge and she will follow-up tomorrow with orthopedics Dr. Hyacinth MeekerMiller.  Return precautions and treatment recommendations and follow-up discussed with the patient who is agreeable with the plan.       ____________________________________________   FINAL CLINICAL IMPRESSION(S) / ED DIAGNOSES  Final diagnoses:  Fall, initial encounter  Acute right hip pain        Note:  This document was  prepared using Dragon voice recognition software and may include unintentional dictation errors       Sharyn CreamerQuale, Ryheem Jay, MD 03/14/19 1358

## 2019-03-14 NOTE — Discharge Instructions (Addendum)
Please call Dr. Rondel Baton office tomorrow morning, he should be able to see or speak you for an appointment tomorrow for follow-up.  Please follow up with your doctor regarding today's Emergency Department (ED) visit and your recent fall.    Return to the ED if you have any headache, confusion, slurred speech, weakness/numbness of any arm or leg, or any increased pain.

## 2019-03-14 NOTE — ED Notes (Signed)
Patient transported to X-ray 

## 2019-03-14 NOTE — ED Notes (Signed)
Patient transported to CT 

## 2019-03-14 NOTE — ED Triage Notes (Signed)
Pt caught foot at home and fell.  C/o right hip pain and is unable to bear weight on that leg.  Pt also hit head hard on refrigerator per her report.  Has had replacement and other surgeries to right hip.  Feels like something is off per pt.

## 2019-03-14 NOTE — Consult Note (Addendum)
Called by Dr. Fanny Bien regarding this 68 y/o female who fell in kitchen today onto her right side.   She presents with pain in the right hip.  Patient is status post bilateral total hip arthroplasties.  I have reviewed the plain films and CT scan of her right hip which show no obvious fracture.  The CT scan is limited due to metallic artifact.  Dr. Fanny Bien states the patient feels comfortable going home as she has the appropriate assist devices that she needs to safely get around.  Dr. Deeann Saint had performed her right total hip arthroplasty and the patient will follow-up with him in the office tomorrow.  Patient will call first thing the morning to make an appointment.

## 2019-03-14 NOTE — ED Notes (Signed)
Pt helped onto bedpan.

## 2019-03-26 ENCOUNTER — Other Ambulatory Visit: Payer: Self-pay | Admitting: Family Medicine

## 2019-03-26 DIAGNOSIS — Z1231 Encounter for screening mammogram for malignant neoplasm of breast: Secondary | ICD-10-CM

## 2019-05-02 ENCOUNTER — Inpatient Hospital Stay (HOSPITAL_COMMUNITY): Payer: Medicare Other

## 2019-05-02 ENCOUNTER — Emergency Department (HOSPITAL_COMMUNITY): Payer: Medicare Other

## 2019-05-02 ENCOUNTER — Encounter (HOSPITAL_COMMUNITY): Payer: Self-pay | Admitting: Emergency Medicine

## 2019-05-02 ENCOUNTER — Emergency Department (HOSPITAL_COMMUNITY): Payer: Medicare Other | Admitting: Anesthesiology

## 2019-05-02 ENCOUNTER — Inpatient Hospital Stay (HOSPITAL_COMMUNITY)
Admission: EM | Admit: 2019-05-02 | Discharge: 2019-05-03 | DRG: 561 | Disposition: A | Discharge status: Home / Self Care | Payer: Medicare Other | Attending: Orthopedic Surgery | Admitting: Orthopedic Surgery

## 2019-05-02 ENCOUNTER — Encounter (HOSPITAL_COMMUNITY)
Admission: EM | Disposition: A | Discharge status: Home / Self Care | Payer: Self-pay | Source: Home / Self Care | Attending: Orthopedic Surgery

## 2019-05-02 ENCOUNTER — Other Ambulatory Visit: Payer: Self-pay

## 2019-05-02 DIAGNOSIS — E039 Hypothyroidism, unspecified: Secondary | ICD-10-CM | POA: Diagnosis present

## 2019-05-02 DIAGNOSIS — Z7982 Long term (current) use of aspirin: Secondary | ICD-10-CM

## 2019-05-02 DIAGNOSIS — Z803 Family history of malignant neoplasm of breast: Secondary | ICD-10-CM

## 2019-05-02 DIAGNOSIS — Z885 Allergy status to narcotic agent status: Secondary | ICD-10-CM | POA: Diagnosis not present

## 2019-05-02 DIAGNOSIS — Z882 Allergy status to sulfonamides status: Secondary | ICD-10-CM

## 2019-05-02 DIAGNOSIS — T84020A Dislocation of internal right hip prosthesis, initial encounter: Principal | ICD-10-CM | POA: Diagnosis present

## 2019-05-02 DIAGNOSIS — Z96643 Presence of artificial hip joint, bilateral: Secondary | ICD-10-CM | POA: Diagnosis not present

## 2019-05-02 DIAGNOSIS — M069 Rheumatoid arthritis, unspecified: Secondary | ICD-10-CM | POA: Diagnosis present

## 2019-05-02 DIAGNOSIS — Z79890 Hormone replacement therapy (postmenopausal): Secondary | ICD-10-CM | POA: Diagnosis not present

## 2019-05-02 DIAGNOSIS — Z800 Family history of malignant neoplasm of digestive organs: Secondary | ICD-10-CM

## 2019-05-02 DIAGNOSIS — M8588 Other specified disorders of bone density and structure, other site: Secondary | ICD-10-CM | POA: Diagnosis present

## 2019-05-02 DIAGNOSIS — S73005A Unspecified dislocation of left hip, initial encounter: Secondary | ICD-10-CM | POA: Diagnosis present

## 2019-05-02 DIAGNOSIS — Z833 Family history of diabetes mellitus: Secondary | ICD-10-CM | POA: Diagnosis not present

## 2019-05-02 DIAGNOSIS — Z981 Arthrodesis status: Secondary | ICD-10-CM | POA: Diagnosis not present

## 2019-05-02 DIAGNOSIS — Z20828 Contact with and (suspected) exposure to other viral communicable diseases: Secondary | ICD-10-CM | POA: Diagnosis present

## 2019-05-02 DIAGNOSIS — Z888 Allergy status to other drugs, medicaments and biological substances status: Secondary | ICD-10-CM

## 2019-05-02 DIAGNOSIS — Z96653 Presence of artificial knee joint, bilateral: Secondary | ICD-10-CM | POA: Diagnosis present

## 2019-05-02 DIAGNOSIS — Z7952 Long term (current) use of systemic steroids: Secondary | ICD-10-CM

## 2019-05-02 DIAGNOSIS — S72001S Fracture of unspecified part of neck of right femur, sequela: Secondary | ICD-10-CM

## 2019-05-02 DIAGNOSIS — J45909 Unspecified asthma, uncomplicated: Secondary | ICD-10-CM | POA: Diagnosis present

## 2019-05-02 DIAGNOSIS — Z79899 Other long term (current) drug therapy: Secondary | ICD-10-CM

## 2019-05-02 DIAGNOSIS — Z791 Long term (current) use of non-steroidal anti-inflammatories (NSAID): Secondary | ICD-10-CM | POA: Diagnosis not present

## 2019-05-02 DIAGNOSIS — S73004A Unspecified dislocation of right hip, initial encounter: Secondary | ICD-10-CM | POA: Diagnosis not present

## 2019-05-02 DIAGNOSIS — Z419 Encounter for procedure for purposes other than remedying health state, unspecified: Secondary | ICD-10-CM

## 2019-05-02 HISTORY — PX: HIP CLOSED REDUCTION: SHX983

## 2019-05-02 LAB — BASIC METABOLIC PANEL
Anion gap: 13 (ref 5–15)
BUN: 11 mg/dL (ref 8–23)
CO2: 28 mmol/L (ref 22–32)
Calcium: 9.1 mg/dL (ref 8.9–10.3)
Chloride: 98 mmol/L (ref 98–111)
Creatinine, Ser: 0.39 mg/dL — ABNORMAL LOW (ref 0.44–1.00)
GFR calc Af Amer: >60 mL/min (ref >60)
GFR calc non Af Amer: >60 mL/min (ref >60)
Glucose, Bld: 110 mg/dL — ABNORMAL HIGH (ref 70–99)
Potassium: 3.8 mmol/L (ref 3.5–5.1)
Sodium: 139 mmol/L (ref 135–145)

## 2019-05-02 LAB — CBC WITH DIFFERENTIAL/PLATELET
Abs Immature Granulocytes: 0.03 10*3/uL (ref 0.00–0.07)
Basophils Absolute: 0 10*3/uL (ref 0.0–0.1)
Basophils Relative: 0 %
Eosinophils Absolute: 0 10*3/uL (ref 0.0–0.5)
Eosinophils Relative: 0 %
HCT: 38.1 % (ref 36.0–46.0)
Hemoglobin: 12.2 g/dL (ref 12.0–15.0)
Immature Granulocytes: 0 %
Lymphocytes Relative: 13 %
Lymphs Abs: 0.9 10*3/uL (ref 0.7–4.0)
MCH: 30.3 pg (ref 26.0–34.0)
MCHC: 32 g/dL (ref 30.0–36.0)
MCV: 94.5 fL (ref 80.0–100.0)
Monocytes Absolute: 0.4 10*3/uL (ref 0.1–1.0)
Monocytes Relative: 5 %
Neutro Abs: 5.4 10*3/uL (ref 1.7–7.7)
Neutrophils Relative %: 82 %
Platelets: 300 10*3/uL (ref 150–400)
RBC: 4.03 MIL/uL (ref 3.87–5.11)
RDW: 14.7 % (ref 11.5–15.5)
WBC: 6.7 10*3/uL (ref 4.0–10.5)
nRBC: 0 % (ref 0.0–0.2)

## 2019-05-02 LAB — SARS CORONAVIRUS 2 BY RT PCR (HOSPITAL ORDER, PERFORMED IN ~~LOC~~ HOSPITAL LAB): SARS Coronavirus 2: NEGATIVE

## 2019-05-02 SURGERY — CLOSED REDUCTION, HIP
Anesthesia: General | Site: Hip | Laterality: Right

## 2019-05-02 MED ORDER — SUCCINYLCHOLINE CHLORIDE 200 MG/10ML IV SOSY
PREFILLED_SYRINGE | INTRAVENOUS | Status: AC
  Filled 2019-05-02: qty 10

## 2019-05-02 MED ORDER — MIDAZOLAM HCL 5 MG/5ML IJ SOLN
INTRAMUSCULAR | Status: DC | PRN
  Administered 2019-05-02: 1 mg via INTRAVENOUS

## 2019-05-02 MED ORDER — METHOCARBAMOL 500 MG PO TABS: 500.0000 mg | ORAL_TABLET | Freq: Four times a day (QID) | ORAL | Status: DC | PRN

## 2019-05-02 MED ORDER — LACTATED RINGERS IV SOLN
INTRAVENOUS | Status: DC | PRN
  Administered 2019-05-02: 17:00:00 via INTRAVENOUS

## 2019-05-02 MED ORDER — PHENOL 1.4 % MT LIQD: 1.0000 | OROMUCOSAL | Status: DC | PRN

## 2019-05-02 MED ORDER — POLYETHYLENE GLYCOL 3350 17 G PO PACK: 17.0000 g | PACK | Freq: Every day | ORAL | Status: DC | PRN

## 2019-05-02 MED ORDER — METOCLOPRAMIDE HCL 5 MG/ML IJ SOLN: 5.0000 mg | Freq: Three times a day (TID) | INTRAMUSCULAR | Status: DC | PRN

## 2019-05-02 MED ORDER — ONDANSETRON HCL 4 MG PO TABS: 4.0000 mg | ORAL_TABLET | Freq: Four times a day (QID) | ORAL | Status: DC | PRN

## 2019-05-02 MED ORDER — EPHEDRINE 5 MG/ML INJ
INTRAVENOUS | Status: AC
  Filled 2019-05-02: qty 10

## 2019-05-02 MED ORDER — ALBUTEROL SULFATE (2.5 MG/3ML) 0.083% IN NEBU: 3.0000 mL | INHALATION_SOLUTION | RESPIRATORY_TRACT | Status: DC | PRN

## 2019-05-02 MED ORDER — MENTHOL 3 MG MT LOZG: 1.0000 | LOZENGE | OROMUCOSAL | Status: DC | PRN

## 2019-05-02 MED ORDER — ALBUTEROL SULFATE (2.5 MG/3ML) 0.083% IN NEBU
3.0000 mL | INHALATION_SOLUTION | RESPIRATORY_TRACT | Status: DC
  Administered 2019-05-02: 3 mL via RESPIRATORY_TRACT
  Filled 2019-05-02: qty 3

## 2019-05-02 MED ORDER — PHENYLEPHRINE HCL (PRESSORS) 10 MG/ML IV SOLN
INTRAVENOUS | Status: DC | PRN
  Administered 2019-05-02: 80 ug via INTRAVENOUS
  Administered 2019-05-02: 40 ug via INTRAVENOUS
  Administered 2019-05-02: 80 ug via INTRAVENOUS
  Administered 2019-05-02 (×3): 120 ug via INTRAVENOUS

## 2019-05-02 MED ORDER — LIDOCAINE 2% (20 MG/ML) 5 ML SYRINGE
INTRAMUSCULAR | Status: AC
  Filled 2019-05-02: qty 5

## 2019-05-02 MED ORDER — HYDROMORPHONE HCL 1 MG/ML IJ SOLN
0.5000 mg | Freq: Once | INTRAMUSCULAR | Status: AC
  Administered 2019-05-02: 0.5 mg via INTRAVENOUS
  Filled 2019-05-02: qty 1

## 2019-05-02 MED ORDER — METHOCARBAMOL 1000 MG/10ML IJ SOLN
500.0000 mg | Freq: Four times a day (QID) | INTRAVENOUS | Status: DC | PRN
  Filled 2019-05-02: qty 5

## 2019-05-02 MED ORDER — LEVOTHYROXINE SODIUM 88 MCG PO TABS
88.0000 ug | ORAL_TABLET | Freq: Every day | ORAL | Status: DC
  Administered 2019-05-03: 88 ug via ORAL
  Filled 2019-05-02: qty 1

## 2019-05-02 MED ORDER — LIDOCAINE 2% (20 MG/ML) 5 ML SYRINGE
INTRAMUSCULAR | Status: DC | PRN
  Administered 2019-05-02: 100 mg via INTRAVENOUS

## 2019-05-02 MED ORDER — ALUM & MAG HYDROXIDE-SIMETH 200-200-20 MG/5ML PO SUSP: 30.0000 mL | ORAL | Status: DC | PRN

## 2019-05-02 MED ORDER — PHENYLEPHRINE 40 MCG/ML (10ML) SYRINGE FOR IV PUSH (FOR BLOOD PRESSURE SUPPORT)
PREFILLED_SYRINGE | INTRAVENOUS | Status: AC
  Filled 2019-05-02: qty 10

## 2019-05-02 MED ORDER — EPHEDRINE SULFATE 50 MG/ML IJ SOLN
INTRAMUSCULAR | Status: DC | PRN
  Administered 2019-05-02: 5 mg via INTRAVENOUS

## 2019-05-02 MED ORDER — SUCCINYLCHOLINE CHLORIDE 20 MG/ML IJ SOLN
INTRAMUSCULAR | Status: DC | PRN
  Administered 2019-05-02: 120 mg via INTRAVENOUS

## 2019-05-02 MED ORDER — DEXTROSE IN LACTATED RINGERS 5 % IV SOLN
INTRAVENOUS | Status: DC
  Administered 2019-05-03: 75 mL/h via INTRAVENOUS

## 2019-05-02 MED ORDER — PREDNISONE 5 MG PO TABS
2.5000 mg | ORAL_TABLET | Freq: Every day | ORAL | Status: DC
  Administered 2019-05-03: 2.5 mg via ORAL
  Filled 2019-05-02: qty 1

## 2019-05-02 MED ORDER — NALOXONE HCL 0.4 MG/ML IJ SOLN
INTRAMUSCULAR | Status: AC
  Filled 2019-05-02: qty 1

## 2019-05-02 MED ORDER — FENTANYL CITRATE (PF) 100 MCG/2ML IJ SOLN
INTRAMUSCULAR | Status: DC | PRN
  Administered 2019-05-02: 100 ug via INTRAVENOUS

## 2019-05-02 MED ORDER — TRAMADOL HCL 50 MG PO TABS
50.0000 mg | ORAL_TABLET | Freq: Four times a day (QID) | ORAL | Status: DC
  Administered 2019-05-02 – 2019-05-03 (×2): 50 mg via ORAL
  Filled 2019-05-02 (×2): qty 1

## 2019-05-02 MED ORDER — NALOXONE HCL 0.4 MG/ML IJ SOLN
INTRAMUSCULAR | Status: DC | PRN
  Administered 2019-05-02: .1 mg via INTRAVENOUS

## 2019-05-02 MED ORDER — ONDANSETRON HCL 4 MG/2ML IJ SOLN
4.0000 mg | Freq: Once | INTRAMUSCULAR | Status: AC
  Administered 2019-05-02: 4 mg via INTRAVENOUS
  Filled 2019-05-02: qty 2

## 2019-05-02 MED ORDER — METOCLOPRAMIDE HCL 5 MG PO TABS: 5.0000 mg | ORAL_TABLET | Freq: Three times a day (TID) | ORAL | Status: DC | PRN

## 2019-05-02 MED ORDER — MIDAZOLAM HCL 2 MG/2ML IJ SOLN
INTRAMUSCULAR | Status: AC
  Filled 2019-05-02: qty 2

## 2019-05-02 MED ORDER — FENTANYL CITRATE (PF) 250 MCG/5ML IJ SOLN
INTRAMUSCULAR | Status: AC
  Filled 2019-05-02: qty 5

## 2019-05-02 MED ORDER — ENOXAPARIN SODIUM 30 MG/0.3ML ~~LOC~~ SOLN
30.0000 mg | Freq: Two times a day (BID) | SUBCUTANEOUS | Status: DC
  Administered 2019-05-03: 30 mg via SUBCUTANEOUS
  Filled 2019-05-02: qty 0.3

## 2019-05-02 MED ORDER — ONDANSETRON HCL 4 MG/2ML IJ SOLN: 4.0000 mg | Freq: Four times a day (QID) | INTRAMUSCULAR | Status: DC | PRN

## 2019-05-02 MED ORDER — PROPOFOL 10 MG/ML IV BOLUS
INTRAVENOUS | Status: AC
  Filled 2019-05-02: qty 20

## 2019-05-02 MED ORDER — PROPOFOL 10 MG/ML IV BOLUS
INTRAVENOUS | Status: DC | PRN
  Administered 2019-05-02: 120 mg via INTRAVENOUS

## 2019-05-02 MED ORDER — DOCUSATE SODIUM 100 MG PO CAPS
100.0000 mg | ORAL_CAPSULE | Freq: Two times a day (BID) | ORAL | Status: DC
  Administered 2019-05-02: 100 mg via ORAL
  Filled 2019-05-02 (×2): qty 1

## 2019-05-02 MED ORDER — BISACODYL 5 MG PO TBEC: 5.0000 mg | DELAYED_RELEASE_TABLET | Freq: Every day | ORAL | Status: DC | PRN

## 2019-05-02 SURGICAL SUPPLY — 2 items
IMMOBILIZER KNEE 20 (SOFTGOODS) ×3 IMPLANT
IMMOBILIZER KNEE 20 THIGH 36 (SOFTGOODS) IMPLANT

## 2019-05-02 NOTE — ED Notes (Signed)
Purewick applied to pt °

## 2019-05-02 NOTE — ED Notes (Signed)
Patient provided phone to update husband.

## 2019-05-02 NOTE — ED Triage Notes (Signed)
Patient arrived from home via [facility]. Reports hearing a "pop" from right hip after bending over to wash foot. Shortening present in right leg. Pain reported in right leg. EMS provided 125 mcg total of Fentanyl during transportation. Sensation felt in all extremities.

## 2019-05-02 NOTE — Anesthesia Procedure Notes (Signed)
Procedure Name: General with mask airway Date/Time: 05/02/2019 5:06 PM Performed by: Suzy Bouchard, CRNA Pre-anesthesia Checklist: Patient identified, Emergency Drugs available, Suction available, Patient being monitored and Timeout performed Patient Re-evaluated:Patient Re-evaluated prior to induction Oxygen Delivery Method: Circle system utilized Preoxygenation: Pre-oxygenation with 100% oxygen Induction Type: IV induction Ventilation: Mask ventilation without difficulty Laryngoscope Size: Glidescope and 3 Grade View: Grade IV Difficulty Due To: Difficulty was anticipated and Difficult Airway-  due to neck instability Comments: Patient induced after preoxygenation.  Patient's neck kept neutral for entirety of airway manipulation and case.  DL x1 with glidescope 3; difficult to even get blade in the mouth due to neck immobility.  Unable to view any airway anatomy with glidescope.  Blade removed and patient masked for procedure.  Easy mask, neck neutral.  Teeth and lips unchanged.    Discussed airway findings with patient in PACU.

## 2019-05-02 NOTE — Brief Op Note (Signed)
05/02/2019  5:14 PM  PATIENT:  Crystal [name]  68 y.o. female  PRE-OPERATIVE DIAGNOSIS:Closed   dislocated total hip on the Right  POST-OPERATIVE DIAGNOSIS:  Same as PRE-Op  PROCEDURE:  Procedure(s): CLOSED REDUCTION HIP (Right)  SURGEON:  Surgeon(s) and Role:    Latanya Maudlin, MD - Primary  PHYSICIAN ASSISTANT:Amber [place] PA   ASSISTANTS: Ardeen Jourdain PA   ANESTHESIA:   general  EBL:  none   BLOOD ADMINISTERED:none  DRAINS: none   LOCAL MEDICATIONS USED:  NONE  SPECIMEN:  No Specimen  DISPOSITION OF SPECIMEN:  N/A  COUNTS:  YES  TOURNIQUET:  * No tourniquets in log *  DICTATION: .Other Dictation: Dictation Number [id number]  PLAN OF CARE: Admit for overnight observation  PATIENT DISPOSITION:  PACU - hemodynamically stable.   Delay start of Pharmacological VTE agent (>24hrs) due to surgical blood loss or risk of bleeding: yes

## 2019-05-02 NOTE — Transfer of Care (Signed)
Immediate Anesthesia Transfer of Care Note  Patient: Crystal [name]  Procedure(s) Performed: CLOSED REDUCTION HIP (Right Hip)  Patient Location: PACU  Anesthesia Type:General  Level of Consciousness: awake, alert  and oriented  Airway & Oxygen Therapy: Patient Spontanous Breathing  Post-op Assessment: Report given to RN, Post -op Vital signs reviewed and stable and Patient moving all extremities X 4  Post vital signs: Reviewed and stable  Last Vitals:  Vitals Value Taken Time  BP 124/75 05/02/2019  5:28 PM  Temp 36.3 C 05/02/2019  5:28 PM  Pulse 96 05/02/2019  5:39 PM  Resp 21 05/02/2019  5:39 PM  SpO2 94 % 05/02/2019  5:39 PM  Vitals shown include unvalidated device data.  Last Pain:  Vitals:   05/02/19 1519  TempSrc:   PainSc: 10-Worst pain ever         Complications: No apparent anesthesia complications

## 2019-05-02 NOTE — Anesthesia Preprocedure Evaluation (Addendum)
Anesthesia Evaluation  Patient identified by MRN, date of birth, ID band Patient awake    Reviewed: Allergy & Precautions, NPO status , Patient's Chart, lab work & pertinent test results  Airway Mallampati: I  TM Distance: >3 FB Neck ROM: Full   Comment: Hx of difficult intubation Prior airway notes reviewed Dental  (+) Teeth Intact, Dental Advisory Given   Pulmonary    Pulmonary exam normal        Cardiovascular Normal cardiovascular exam     Neuro/Psych    GI/Hepatic   Endo/Other    Renal/GU      Musculoskeletal   Abdominal   Peds  Hematology   Anesthesia Other Findings   Reproductive/Obstetrics                            Anesthesia Physical Anesthesia Plan  ASA: III and emergent  Anesthesia Plan: General   Post-op Pain Management:    Induction: Intravenous  PONV Risk Score and Plan: 3 and Ondansetron and Treatment may vary due to age or medical condition  Airway Management Planned: Oral ETT  Additional Equipment:   Intra-op Plan:   Post-operative Plan: Extubation in OR  Informed Consent: I have reviewed the patients History and Physical, chart, labs and discussed the procedure including the risks, benefits and alternatives for the proposed anesthesia with the patient or authorized representative who has indicated his/her understanding and acceptance.       Plan Discussed with: CRNA and Surgeon  Anesthesia Plan Comments:         Anesthesia Quick Evaluation

## 2019-05-02 NOTE — ED Notes (Signed)
ED Provider at bedside. 

## 2019-05-02 NOTE — ED Provider Notes (Signed)
[facility] EMERGENCY DEPARTMENT Provider Note   CSN: [id number] Arrival date & time: 05/02/19  1342    History   Chief Complaint Chief Complaint  Patient presents with  . Hip Pain    HPI Crystal [name] is a 68 y.o. female with history of asthma, collagen vascular disease, rheumatoid arthritis, bilateral total hip arthroplasty presents brought in by EMS for evaluation of acute onset, persistent right hip pain occurring just prior to arrival.  She reports that she was sitting in a chair bending down to wash her feet when she felt her right hip "pop out ".  She believes that she dislocated the hip and reports that she has done this in the past in 2012.  She notes a constant dull pain radiating from the hip down the entire right lower extremity.  Pain worsens with any movement.  Denies numbness or tingling.  She denies any falls, head injury, or loss of consciousness. She was seen and evaluated at [facility] ED on 03/14/2019 for hip pain after mechanical fall with no evidence of fracture or dislocation on imaging and reports she had been steadily improving since then, with yesterday being "the best day yet" until her incident today.      The history is provided by the patient.    Past Medical History:  Diagnosis Date  . Asthma   . Collagen vascular disease ([place])   . Cystocele    2nd degree  . Enterocele    early  . Muscle inflammation   . Rectocele    mild  . Rheumatoid arthritis ([place])   . Vaginal atrophy     Patient Active Problem List   Diagnosis Date Noted  . Family history of breast cancer 07/23/2018  . 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 B12 deficiency 07/12/2015  . Borderline blood pressure 07/12/2015  . Midline cystocele 07/12/2015  . Osteopenia 07/12/2015  . Arthritis or polyarthritis, rheumatoid ([place]) 07/12/2015  . Near syncope 07/12/2015  . Thyroid nodule 07/12/2015  . History of melena  09/20/2014  . Pain in rectum 09/20/2014  . Adult hypothyroidism 08/12/2014  . History of implantation of joint prosthesis of elbow 05/11/2014  . Ankle pain 03/16/2012  . Rheumatoid arthritis of foot ([place]) 03/16/2012  . History of artificial joint 03/13/2012    Past Surgical History:  Procedure Laterality Date  . ABDOMINAL HYSTERECTOMY  1980  . ANTERIOR AND POSTERIOR VAGINAL REPAIR  2009   enterocele ligation  . CARPAL TUNNEL RELEASE Left    1982  . ELBOW SURGERY    . FOOT SURGERY Bilateral 1993  . hand surgery  2002   7 joint implants  . knee replacement Bilateral 1993  . neck fusion  2000  . SHOULDER ARTHROSCOPY  2001  . TEMPOROMANDIBULAR JOINT ARTHROPLASTY  1991  . THYROID SURGERY  1999  . TOTAL HIP ARTHROPLASTY Bilateral [id number]  . WRIST SURGERY Left 1986     OB History    Gravida  2   Para  2   Term  2   Preterm      AB      Living  2     SAB      TAB      Ectopic      Multiple      Live Births  2            Home Medications    Prior to Admission medications   Medication Sig Start Date  End Date Taking? Authorizing Provider  albuterol (PROAIR HFA) 108 (90 BASE) MCG/ACT inhaler Inhale into the lungs. 08/12/14   [provider]  aspirin EC 81 MG tablet Take by mouth.    [provider]  Calcium-Vitamin D (CALTRATE 600 PLUS-VIT D PO) Take by mouth.    [provider]  Cyanocobalamin (RA VITAMIN B-12 TR) 1000 MCG TBCR Take by mouth.    [provider]  etanercept (ENBREL) 25 MG injection Inject 25 mg into the skin.    [provider]  etodolac (LODINE) 500 MG tablet TAKE ONE (1) TABLET BY MOUTH TWO (2) TIMES DAILY 01/17/15   [provider]  ferrous sulfate 325 (65 FE) MG tablet Take 325 mg by mouth daily with breakfast.    [provider]  levothyroxine (SYNTHROID, LEVOTHROID) 88 MCG tablet Take by mouth.    [provider]  methotrexate (RHEUMATREX) 2.5 MG tablet Take by  mouth. 06/03/16   [provider]  Multiple Vitamin (MULTI-VITAMINS) TABS Take by mouth.    [provider]  Multiple Vitamins-Minerals (PRESERVISION AREDS 2 PO) Take by mouth.    [provider]  Omega-3 Fatty Acids (FISH OIL) 1000 MG CAPS Take by mouth.    [provider]  omeprazole (PRILOSEC) 20 MG capsule Take by mouth. 11/07/14   [provider]  predniSONE (DELTASONE) 5 MG tablet Take 2.5 mg by mouth.  02/21/15   [provider]  promethazine (PHENERGAN) 25 MG tablet 1/2-1 TAB Q 6-8 HRS PRN N/V 06/15/15   [provider]  traMADol (ULTRAM) 50 MG tablet TAKE 1 TBALET BY MOUTH TWICE A DAY AS NEEDED FOR PAIN 07/17/17   [provider]  vitamin E 400 UNIT capsule Take by mouth.    [provider]    Family History Family History  Problem Relation Age of Onset  . Breast cancer Maternal Aunt        32  . Breast cancer Paternal Aunt 21  . Diabetes Paternal Aunt   . Breast cancer Cousin        3 maternal cousins, <50  . Colon cancer Cousin   . Diabetes Mother   . Diabetes Brother   . Breast cancer Maternal Aunt 61  . Ovarian cancer Neg Hx     Social History Social History   Tobacco Use  . Smoking status: Never Smoker  . Smokeless tobacco: Never Used  Substance Use Topics  . Alcohol use: No    Alcohol/week: 0.0 standard drinks  . Drug use: No     Allergies   B12-c-fa-fe [iron-vit c-vit b12-folic acid]; Sulfa antibiotics; Vitamin b12; Alendronate sodium; Demerol [meperidine]; Diclofenac; Humira [adalimumab]; Hydrocodone-acetaminophen; Ibandronic acid; Morphine and related; Nalfon [fenoprofen calcium]; Remicade [infliximab]; Sulfasalazine; Imuran [azathioprine]; Oxaprozin; and Oxcarbazepine   Review of Systems Review of Systems  Musculoskeletal: Positive for arthralgias.  Neurological: Negative for weakness and numbness.  All other systems reviewed and are negative.    Physical Exam Updated  Vital Signs BP (!) 152/84   Pulse 91   Temp 98.2 F (36.8 C) (Oral)   Resp 20   Ht 5\' 3"  (1.6 m)   Wt 54 kg   SpO2 96%   BMI 21.08 kg/m   Physical Exam Vitals signs and nursing note reviewed.  Constitutional:      General: She is not in acute distress.    Appearance: She is well-developed.  HENT:     Head: Normocephalic and atraumatic.  Eyes:  General:        Right eye: No discharge.        Left eye: No discharge.     Conjunctiva/sclera: Conjunctivae normal.  Neck:     Vascular: No JVD.     Trachea: No tracheal deviation.  Cardiovascular:     Rate and Rhythm: Normal rate.     Pulses: Normal pulses.     Comments: 2+ DP/PT pulses bilaterally Pulmonary:     Effort: Pulmonary effort is normal.  Abdominal:     General: There is no distension.  Musculoskeletal:     Comments: Right lower extremity shortened, internally rotated at the hip.  Limited examination of the right lower extremity secondary to hip pain.  No midline lumbar spine tenderness, crepitus, or stepoff noted.   Skin:    General: Skin is warm and dry.     Capillary Refill: Capillary refill takes less than 2 seconds.     Findings: No erythema.  Neurological:     Mental Status: She is alert.     Comments: Sensation intact to soft touch of bilateral lower extremities.  Psychiatric:        Behavior: Behavior normal.      ED Treatments / Results  Labs (all labs ordered are listed, but only abnormal results are displayed) Labs Reviewed  SARS CORONAVIRUS 2 (HOSPITAL ORDER, PERFORMED IN [facility])  CBC WITH DIFFERENTIAL/PLATELET  BASIC METABOLIC PANEL    EKG None  Radiology Dg Hip Port Unilat W Or Wo Pelvis 1 View Right  Result Date: 05/02/2019 CLINICAL DATA:  Heard pop from right hip after bending over to wash foot. History of hip surgery and right hip dislocation. EXAM: DG HIP (WITH OR WITHOUT PELVIS) 1V PORT RIGHT COMPARISON:  03/14/2019 FINDINGS: Examination demonstrates bilateral  total hip arthroplasties. There is dislocation of the femoral component of the right hip prosthesis superolateral to the acetabular component. There is diffuse osteopenia. Findings suggesting a fracture along the lateral cortex of the subtrochanteric region of the right femur. IMPRESSION: Dislocated right femoral component of the hip prosthesis. Suggestion of fracture along the lateral aspect of the right femoral subtrochanteric region. Electronically Signed   By: Elberta Fortisaniel  Boyle M.D.   On: 05/02/2019 15:10    Procedures Procedures (including critical care time)  Medications Ordered in ED Medications  HYDROmorphone (DILAUDID) injection 0.5 mg (0.5 mg Intravenous Given 05/02/19 1422)  ondansetron (ZOFRAN) injection 4 mg (4 mg Intravenous Given 05/02/19 1421)  HYDROmorphone (DILAUDID) injection 0.5 mg (0.5 mg Intravenous Given 05/02/19 1541)     Initial Impression / Assessment and Plan / ED Course  I have reviewed the triage vital signs and the nursing notes.  Pertinent labs & imaging results that were available during my care of the patient were reviewed by me and considered in my medical decision making (see chart for details).  Patient with history of rheumatoid arthritis and osteopenia presenting for evaluation of acute onset right hip pain, concerned that she may have dislocated her right hip arthroplasty.  No fall, head injury, or loss of consciousness.  She is afebrile, hypertensive in the ED but vital signs otherwise stable.  She is nontoxic in appearance.  She is neurovascularly intact.  She is not on any anticoagulation.   Examination somewhat limited due to pain and limited range of motion.  Radiographs show dislocated right femoral component of hip prosthesis with questionable fracture along the lateral aspect of the right femoral subtrochanteric region.  She has a history of  difficult airway in the past and has limited neck mobility due to her rheumatoid arthritis and so would benefit from  sedation in the OR under a more controlled setting for reduction of her dislocation/fracture.  Spoke with Dr. Darrelyn Hillock with [facility] who recommends hospitalist admission and plans to take the patient to the OR shortly.  Will consent patient for a closed, possibly open, reduction of right total hip arthroplasty. Pain managed while in the ED.  COVID test and baseline labs ordered and pending.  Final Clinical Impressions(s) / ED Diagnoses   Final diagnoses:  Closed fracture dislocation of right hip joint, sequela    ED Discharge Orders    None       Jeanie Sewer, PA-C 05/02/19 1553    Linwood Dibbles, MD 05/04/19 1529

## 2019-05-02 NOTE — H&P (Addendum)
Consult Note   Crystal [name] ZOX:[id number] DOB: 30-Jul-1951 DOA: 05/02/2019  PCP: Kandyce RudBabaoff, Marcus, MD Consultants:  [facility] - rheumatology; [place] [facility]; [facility] - orthopedics; Dutch QuintPoole - neurosurgery Patient coming from:  Home - lives with husband; NOK: Husband, [phone]  Chief Complaint: Hip pain  HPI: Crystal MinerHilda Madavern [name] is a 68 y.o. female with medical history significant of RA presenting with hip pain.  She showered and dressed today and sat down in a chair.  She is wearing an air cast because of a chronic ankle problem.  She bent over and her hip popped out of place.  She was unable to move until EMS arrived.  She fell in her kitchen 7 weeks ago on that hip - x-ray and CT was negative at that time.  She has not had ongoing issues with the hip since.  She had a follow-up with Dr. Lequita HaltAluisio and it looked good at that time.  Hip was replaced in 1993 and she had a revision in 2012 after a dislocation.   ED Course:  Hip fracture/dislocation from a fall.  H/o high-risk intubation, decided to do relocation in OR.  Dr. Darrelyn HillockGioffre has seen the patient.  OR at 4:30.  Review of Systems: As per HPI; otherwise review of systems reviewed and negative.   Ambulatory Status:  Ambulates without assistance or with a cane  Past Medical History:  Diagnosis Date  . Asthma   . Collagen vascular disease (HCC)   . Cystocele    2nd degree  . Enterocele    early  . Gallstones 11/2018  . Muscle inflammation   . Rectocele    mild  . Rheumatoid arthritis ([facility])   . Vaginal atrophy     Past Surgical History:  Procedure Laterality Date  . ABDOMINAL HYSTERECTOMY  1980  . ANTERIOR AND POSTERIOR VAGINAL REPAIR  2009   enterocele ligation  . CARPAL TUNNEL RELEASE Left    1982  . ELBOW SURGERY    . FOOT SURGERY Bilateral 1993  . hand surgery  2002   7 joint implants  . knee replacement Bilateral 1993  . neck fusion  2000  . SHOULDER ARTHROSCOPY  2001  . TEMPOROMANDIBULAR  JOINT ARTHROPLASTY  1991  . THYROID SURGERY  1999  . TOTAL HIP ARTHROPLASTY Bilateral [id number],1995  . WRIST SURGERY Left 1986    Social History   Socioeconomic History  . Marital status: Married    Spouse name: Not on file  . Number of children: Not on file  . Years of education: Not on file  . Highest education level: Not on file  Occupational History  . Occupation: retired  Engineer, productionocial Needs  . Financial resource strain: Not on file  . Food insecurity:    Worry: Not on file    Inability: Not on file  . Transportation needs:    Medical: Not on file    Non-medical: Not on file  Tobacco Use  . Smoking status: Never Smoker  . Smokeless tobacco: Never Used  Substance and Sexual Activity  . Alcohol use: No    Alcohol/week: 0.0 standard drinks  . Drug use: No  . Sexual activity: Not Currently    Birth control/protection: Surgical  Lifestyle  . Physical activity:    Days per week: Not on file    Minutes per session: Not on file  . Stress: Not on file  Relationships  . Social connections:    Talks on phone: Not on file    Gets  together: Not on file    Attends religious service: Not on file    Active member of club or organization: Not on file    Attends meetings of clubs or organizations: Not on file    Relationship status: Not on file  . Intimate partner violence:    Fear of current or ex partner: Not on file    Emotionally abused: Not on file    Physically abused: Not on file    Forced sexual activity: Not on file  Other Topics Concern  . Not on file  Social History Narrative  . Not on file    Allergies  Allergen Reactions  . B12-C-Fa-Fe [Iron-Vit C-Vit B12-Folic Acid] Shortness Of Breath  . Sulfa Antibiotics Other (See Comments)    Caused skin to come off in her mouth and down her throat.    . Vitamin B12 Anaphylaxis    When she takes a *HIGH DOSE*  . Alendronate Sodium     Other reaction(s): Other (See Comments) RECTAL BLEEDING  . Demerol [Meperidine] Nausea  Only  . Diclofenac     Other reaction(s): Other (See Comments) MOUTH ULCERS  . Humira [Adalimumab] Swelling    brain  . Hydrocodone-Acetaminophen Nausea Only  . Ibandronic Acid Other (See Comments)    CONSTIPATION Other reaction(s): Other (See Comments) CONSTIPATION  . Morphine And Related Nausea And Vomiting  . Nalfon [Fenoprofen Calcium] Hives  . Remicade [Infliximab]     Drug induce lupus  . Sulfasalazine Other (See Comments)    Ulcer   . Imuran [Azathioprine] Rash  . Oxaprozin Rash  . Oxcarbazepine Rash    Skin peeling, like a sunburn    Family History  Problem Relation Age of Onset  . Breast cancer Maternal Aunt        32  . Breast cancer Paternal Aunt 8350  . Diabetes Paternal Aunt   . Breast cancer Cousin        3 maternal cousins, <50  . Colon cancer Cousin   . Diabetes Mother   . Diabetes Brother   . Breast cancer Maternal Aunt 61  . Ovarian cancer Neg Hx     Prior to Admission medications   Medication Sig Start Date End Date Taking? Authorizing Provider  albuterol (PROAIR HFA) 108 (90 BASE) MCG/ACT inhaler Inhale into the lungs. 08/12/14   [provider]  aspirin EC 81 MG tablet Take by mouth.    [provider]  Calcium-Vitamin D (CALTRATE 600 PLUS-VIT D PO) Take by mouth.    [provider]  Cyanocobalamin (RA VITAMIN B-12 TR) 1000 MCG TBCR Take by mouth.    [provider]  etanercept (ENBREL) 25 MG injection Inject 25 mg into the skin.    [provider]  etodolac (LODINE) 500 MG tablet TAKE ONE (1) TABLET BY MOUTH TWO (2) TIMES DAILY 01/17/15   [provider]  ferrous sulfate 325 (65 FE) MG tablet Take 325 mg by mouth daily with breakfast.    [provider]  levothyroxine (SYNTHROID, LEVOTHROID) 88 MCG tablet Take by mouth.    [provider]  methotrexate (RHEUMATREX) 2.5 MG tablet Take by mouth. 06/03/16   [provider]  Multiple Vitamin (MULTI-VITAMINS) TABS Take by  mouth.    [provider]  Multiple Vitamins-Minerals (PRESERVISION AREDS 2 PO) Take by mouth.    [provider]  Omega-3 Fatty Acids (FISH OIL) 1000 MG CAPS Take by mouth.    [provider]  omeprazole (PRILOSEC)  20 MG capsule Take by mouth. 11/07/14   [provider]  predniSONE (DELTASONE) 5 MG tablet Take 2.5 mg by mouth.  02/21/15   [provider]  promethazine (PHENERGAN) 25 MG tablet 1/2-1 TAB Q 6-8 HRS PRN N/V 06/15/15   [provider]  traMADol (ULTRAM) 50 MG tablet TAKE 1 TBALET BY MOUTH TWICE A DAY AS NEEDED FOR PAIN 07/17/17   [provider]  vitamin E 400 UNIT capsule Take by mouth.    [provider]    Physical Exam: Vitals:   05/02/19 1745 05/02/19 1759 05/02/19 1800 05/02/19 1836  BP: 140/85 117/78  122/90  Pulse: 92 (!) 102  95  Resp: 20 20 20 18   Temp:  97.6 F (36.4 C)  98.4 F (36.9 C)  TempSrc:    Oral  SpO2: 97% 97%  97%  Weight:      Height:         . General:  Appears calm and comfortable and is NAD . Eyes:  PERRL, EOMI, normal lids, iris . ENT:  grossly normal hearing, lips & tongue, mmm . Neck:  no LAD, masses or thyromegaly . Cardiovascular:  RRR, no m/r/g. No LE edema.  Marland Kitchen Respiratory:   CTA bilaterally with no wheezes/rales/rhonchi.  Normal respiratory effort. . Abdomen:  soft, NT, ND, NABS . Skin:  no rash or induration seen on limited exam . Musculoskeletal:  RLE hip dislocation, shortening.  Chronic contractures with severe deformity of B hands . Psychiatric:  grossly normal mood and affect, speech fluent and appropriate, AOx3 . Neurologic:  CN 2-12 grossly intact    Radiological Exams on Admission: Dg Hip Port Unilat W Or Wo Pelvis 1 View Right  Result Date: 05/02/2019 CLINICAL DATA:  Heard pop from right hip after bending over to wash foot. History of hip surgery and right hip dislocation. EXAM: DG HIP (WITH OR WITHOUT PELVIS) 1V PORT RIGHT COMPARISON:  03/14/2019  FINDINGS: Examination demonstrates bilateral total hip arthroplasties. There is dislocation of the femoral component of the right hip prosthesis superolateral to the acetabular component. There is diffuse osteopenia. Findings suggesting a fracture along the lateral cortex of the subtrochanteric region of the right femur. IMPRESSION: Dislocated right femoral component of the hip prosthesis. Suggestion of fracture along the lateral aspect of the right femoral subtrochanteric region. Electronically Signed   By: Marin Olp M.D.   On: 05/02/2019 15:10    EKG: not done   Labs on Admission: I have personally reviewed the available labs and imaging studies at the time of the admission.  Pertinent labs:   Glucose 110 Normal CBC COVID negative  Assessment/Plan Principal Problem:   Hip dislocation, right, initial encounter ([place]) Active Problems:   Arthritis or polyarthritis, rheumatoid (HCC)   Adult hypothyroidism    R hip disclocation -Patient with h/o hip replacement presenting with hip dislocation as well as concern for periprosthetic fracture -Orthopedics consulted -NPO in anticipation of surgical repair today -SCDs overnight, start Lovenox post-operatively (or as per ortho) -Pain control with Ultram, Robaxin prn -May need PT consult post-operatively -Consider a pre-operative neck x-ray if RA is present to assess for atlanto-occipital stabilization - this was ordered but does not appear to have been completed prior to surgery  RA -Patient with debilitating disease and deformities -Hold Enbrel, Methotrexate for now -Continue Prednisone  Hypothyroidism -Normal TSH in 10/19 -Continue Synthroid at current dose for now   Note: This patient has been tested and is negative for the novel coronavirus  COVID-19.   DVT prophylaxis:  SCDs until approved for Lovenox by orthopedics Code Status:  Full - confirmed with patient Family Communication: None present; Dr. Darrelyn Hillock was to call  husband post-operatively    *Patient was admitted by the orthopedics service and appears to be well cared for at this time.  There does not appear to be a need for ongoing hospitalist involvement at this time.  Will sign off; please re-consult if needed.      Jonah Blue MD [facility]   How to contact the [facility] Attending or Consulting provider 7A - 7P or covering provider during after hours 7P -7A, for this patient?  1. Check the care team in [facility] and look for a) attending/consulting [facility] provider listed and b) the [facility] team listed 2. Log into [email or web address] and use [place]'s universal password to access. If you do not have the password, please contact the hospital operator. 3. Locate the [facility] provider you are looking for under [facility] and page to a number that you can be directly reached. 4. If you still have difficulty reaching the provider, please page the [facility] (Director on Call) for the Hospitalists listed on amion for assistance.   05/02/2019, 6:48 PM

## 2019-05-02 NOTE — Discharge Instructions (Addendum)
Weightbearing as tolerated with walker Must wear knee immobilizer Follow up in office to see Dr. Wynelle Link in 2 weeks

## 2019-05-02 NOTE — Anesthesia Postprocedure Evaluation (Signed)
Anesthesia Post Note  Patient: Antonietta Lansdowne  Procedure(s) Performed: CLOSED REDUCTION HIP (Right Hip)     Patient location during evaluation: PACU Anesthesia Type: General Level of consciousness: awake and alert Pain management: pain level controlled Vital Signs Assessment: post-procedure vital signs reviewed and stable Respiratory status: spontaneous breathing, nonlabored ventilation, respiratory function stable and patient connected to nasal cannula oxygen Cardiovascular status: blood pressure returned to baseline and stable Postop Assessment: no apparent nausea or vomiting Anesthetic complications: no    Last Vitals:  Vitals:   05/02/19 1935 05/02/19 2043  BP: 138/73   Pulse: 98   Resp: 18   Temp: 36.8 C   SpO2: 95% 96%    Last Pain:  Vitals:   05/02/19 1942  TempSrc:   PainSc: 0-No pain                 Leanne Sisler DAVID

## 2019-05-02 NOTE — ED Provider Notes (Signed)
Medical screening examination/treatment/procedure(s) were conducted as a shared visit with non-physician practitioner(s) and myself.  I personally evaluated the patient during the encounter.  Patient presents to the ED after a hip dislocation.  Patient will require reduction.  I had a discussion with her about prior surgical procedures in order to determine whether we could safely sedate here in the ED.  Patient states she has a history of a difficult airway.  She has limited neck mobility because of her rheumatoid arthritis.  Considering these factors I think it would be more appropriate for the patient to be sedated in the OR under a more controlled setting.   Dorie Rank, MD 05/02/19 1450

## 2019-05-02 NOTE — H&P (Signed)
Crystal [name] is an 68 y.o. female.   Chief Complaint: Painin her Right Hip HPI: She dislocated her Right Total Hip  Past Medical History:  Diagnosis Date  . Asthma   . Collagen vascular disease ([place])   . Cystocele    2nd degree  . Enterocele    early  . Muscle inflammation   . Rectocele    mild  . Rheumatoid arthritis ([place])   . Vaginal atrophy     Past Surgical History:  Procedure Laterality Date  . ABDOMINAL HYSTERECTOMY  1980  . ANTERIOR AND POSTERIOR VAGINAL REPAIR  2009   enterocele ligation  . CARPAL TUNNEL RELEASE Left    1982  . ELBOW SURGERY    . FOOT SURGERY Bilateral 1993  . hand surgery  2002   7 joint implants  . knee replacement Bilateral 1993  . neck fusion  2000  . SHOULDER ARTHROSCOPY  2001  . TEMPOROMANDIBULAR JOINT ARTHROPLASTY  1991  . THYROID SURGERY  1999  . TOTAL HIP ARTHROPLASTY Bilateral [id number]  . WRIST SURGERY Left 1986    Family History  Problem Relation Age of Onset  . Breast cancer Maternal Aunt        32  . Breast cancer Paternal Aunt 30  . Diabetes Paternal Aunt   . Breast cancer Cousin        3 maternal cousins, <50  . Colon cancer Cousin   . Diabetes Mother   . Diabetes Brother   . Breast cancer Maternal Aunt 61  . Ovarian cancer Neg Hx    Social History:  reports that she has never smoked. She has never used smokeless tobacco. She reports that she does not drink alcohol or use drugs.  Allergies:  Allergies  Allergen Reactions  . B12-C-Fa-Fe [Iron-Vit C-Vit B12-Folic Acid] Shortness Of Breath  . Sulfa Antibiotics Other (See Comments)    Caused skin to come off in her mouth and down her throat.    . Vitamin B12 Anaphylaxis    When she takes a *HIGH DOSE*  . Alendronate Sodium     Other reaction(s): Other (See Comments) RECTAL BLEEDING  . Demerol [Meperidine] Nausea Only  . Diclofenac     Other reaction(s): Other (See Comments) MOUTH ULCERS  . Humira [Adalimumab] Swelling    brain  .  Hydrocodone-Acetaminophen Nausea Only  . Ibandronic Acid Other (See Comments)    CONSTIPATION Other reaction(s): Other (See Comments) CONSTIPATION  . Morphine And Related Nausea And Vomiting  . Nalfon [Fenoprofen Calcium] Hives  . Remicade [Infliximab]     Drug induce lupus  . Sulfasalazine Other (See Comments)    Ulcer   . Imuran [Azathioprine] Rash  . Oxaprozin Rash  . Oxcarbazepine Rash    Skin peeling, like a sunburn    (Not in a hospital admission)   Results for orders placed or performed during the hospital encounter of 05/02/19 (from the past 48 hour(s))  Basic metabolic panel     Status: Abnormal   Collection Time: 05/02/19  3:08 PM  Result Value Ref Range   Sodium 139 135 - 145 mmol/L   Potassium 3.8 3.5 - 5.1 mmol/L   Chloride 98 98 - 111 mmol/L   CO2 28 22 - 32 mmol/L   Glucose, Bld 110 (H) 70 - 99 mg/dL   BUN 11 8 - 23 mg/dL   Creatinine, Ser 0.39 (L) 0.44 - 1.00 mg/dL   Calcium 9.1 8.9 - 10.3 mg/dL   GFR calc non Af  Amer >60 >60 mL/min   GFR calc Af Amer >60 >60 mL/min   Anion gap 13 5 - 15    Comment: Performed at [facility], [address], [place], [place] [address]  CBC with Differential     Status: None   Collection Time: 05/02/19  3:08 PM  Result Value Ref Range   WBC 6.7 4.0 - 10.5 K/uL   RBC 4.03 3.87 - 5.11 MIL/uL   Hemoglobin 12.2 12.0 - 15.0 g/dL   HCT 07.6 22.6 - 33.3 %   MCV 94.5 80.0 - 100.0 fL   MCH 30.3 26.0 - 34.0 pg   MCHC 32.0 30.0 - 36.0 g/dL   RDW 54.5 62.5 - 63.8 %   Platelets 300 150 - 400 K/uL   nRBC 0.0 0.0 - 0.2 %   Neutrophils Relative % 82 %   Neutro Abs 5.4 1.7 - 7.7 K/uL   Lymphocytes Relative 13 %   Lymphs Abs 0.9 0.7 - 4.0 K/uL   Monocytes Relative 5 %   Monocytes Absolute 0.4 0.1 - 1.0 K/uL   Eosinophils Relative 0 %   Eosinophils Absolute 0.0 0.0 - 0.5 K/uL   Basophils Relative 0 %   Basophils Absolute 0.0 0.0 - 0.1 K/uL   Immature Granulocytes 0 %   Abs Immature Granulocytes 0.03 0.00 - 0.07 K/uL     Comment: Performed at [facility], [address] [place], [place] [address]   Dg Hip Port Unilat W Or [place] Pelvis 1 View Right  Result Date: 05/02/2019 CLINICAL DATA:  Marthe Patch pop from right hip after bending over to wash foot. History of hip surgery and right hip dislocation. EXAM: DG HIP (WITH OR WITHOUT PELVIS) 1V PORT RIGHT COMPARISON:  03/14/2019 FINDINGS: Examination demonstrates bilateral total hip arthroplasties. There is dislocation of the femoral component of the right hip prosthesis superolateral to the acetabular component. There is diffuse osteopenia. Findings suggesting a fracture along the lateral cortex of the subtrochanteric region of the right femur. IMPRESSION: Dislocated right femoral component of the hip prosthesis. Suggestion of fracture along the lateral aspect of the right femoral subtrochanteric region. Electronically Signed   By: Elberta Fortis M.D.   On: 05/02/2019 15:10    Review of Systems  Constitutional: Negative.   HENT: Negative.   Eyes: Negative.   Respiratory: Negative.   Cardiovascular: Negative.   Gastrointestinal: Negative.   Genitourinary: Negative.   Musculoskeletal: Positive for falls and joint pain.  Skin: Negative.   Neurological: Negative.   Endo/Heme/Allergies: Negative.   Psychiatric/Behavioral: Negative.     Blood pressure 133/79, pulse 87, temperature 98.2 F (36.8 C), temperature source Oral, resp. rate 20, height 5\' 3"  (1.6 m), weight 54 kg, SpO2 95 %. Physical Exam   Assessment/Plan Closed Reduction of her Right Total Hip Dislocation  , MD 05/02/2019, 3:59 PM

## 2019-05-03 ENCOUNTER — Encounter (HOSPITAL_COMMUNITY): Payer: Self-pay | Admitting: Orthopedic Surgery

## 2019-05-03 DIAGNOSIS — S73004A Unspecified dislocation of right hip, initial encounter: Secondary | ICD-10-CM | POA: Diagnosis not present

## 2019-05-03 DIAGNOSIS — T84020A Dislocation of internal right hip prosthesis, initial encounter: Secondary | ICD-10-CM | POA: Diagnosis not present

## 2019-05-03 LAB — BASIC METABOLIC PANEL
Anion gap: 10 (ref 5–15)
BUN: 11 mg/dL (ref 8–23)
CO2: 28 mmol/L (ref 22–32)
Calcium: 8.7 mg/dL — ABNORMAL LOW (ref 8.9–10.3)
Chloride: 100 mmol/L (ref 98–111)
Creatinine, Ser: 0.4 mg/dL — ABNORMAL LOW (ref 0.44–1.00)
GFR calc Af Amer: >60 mL/min (ref >60)
GFR calc non Af Amer: >60 mL/min (ref >60)
Glucose, Bld: 94 mg/dL (ref 70–99)
Potassium: 3.6 mmol/L (ref 3.5–5.1)
Sodium: 138 mmol/L (ref 135–145)

## 2019-05-03 LAB — CBC WITH DIFFERENTIAL/PLATELET
Abs Immature Granulocytes: 0.01 10*3/uL (ref 0.00–0.07)
Basophils Absolute: 0 10*3/uL (ref 0.0–0.1)
Basophils Relative: 0 %
Eosinophils Absolute: 0.2 10*3/uL (ref 0.0–0.5)
Eosinophils Relative: 4 %
HCT: 35.3 % — ABNORMAL LOW (ref 36.0–46.0)
Hemoglobin: 11.3 g/dL — ABNORMAL LOW (ref 12.0–15.0)
Immature Granulocytes: 0 %
Lymphocytes Relative: 27 %
Lymphs Abs: 1.6 10*3/uL (ref 0.7–4.0)
MCH: 30.4 pg (ref 26.0–34.0)
MCHC: 32 g/dL (ref 30.0–36.0)
MCV: 94.9 fL (ref 80.0–100.0)
Monocytes Absolute: 0.6 10*3/uL (ref 0.1–1.0)
Monocytes Relative: 10 %
Neutro Abs: 3.5 10*3/uL (ref 1.7–7.7)
Neutrophils Relative %: 59 %
Platelets: 273 10*3/uL (ref 150–400)
RBC: 3.72 MIL/uL — ABNORMAL LOW (ref 3.87–5.11)
RDW: 15 % (ref 11.5–15.5)
WBC: 5.9 10*3/uL (ref 4.0–10.5)
nRBC: 0 % (ref 0.0–0.2)

## 2019-05-03 NOTE — Progress Notes (Signed)
Patient discharging home. Discharge instructions explained to patient and she verbalized understanding. Took all personal belongings. No further questions or concerned voiced.

## 2019-05-03 NOTE — Evaluation (Signed)
Physical Therapy Evaluation Patient Details Name: Crystal [name] MRN: [id number] DOB: Jul 22, 1951 Today's Date: 05/03/2019   History of Present Illness   Crystal [name] is a 68 y.o. female with medical history significant of RA presenting with hip pain.  She showered and dressed today and sat down in a chair.  She is wearing an air cast because of a chronic ankle problem.  She bent over and her hip popped out of place. Underwent closed reduction 05/02/19.   Clinical Impression  Patient evaluated by Physical Therapy with no further acute PT needs identified. All education has been completed and the patient has no further questions. Pt had difficulty getting to EOB with KI because she could not hook R knee on EOB and tended to flex at hip. Vc's for maintaining no hip flex past 90. Min A needed. Pt ambulated 20' with RW as well as practicing stairs. She is currently min A level and reports her husband can give her this at home. Recommend HHPT until f/u with orthopedic.  See below for any follow-up Physical Therapy or equipment needs. PT is signing off. Thank you for this referral.     Follow Up Recommendations Home health PT    Equipment Recommendations  None recommended by PT    Recommendations for Other Services       Precautions / Restrictions Precautions Precautions: Fall;Posterior Hip Precaution Booklet Issued: No Precaution Comments: pt very aware of precautions as this is her second dislocation. Sometimes needs body awareness cues to keep though Required Braces or Orthoses: Knee Immobilizer - Right Knee Immobilizer - Right: On at all times Restrictions Weight Bearing Restrictions: Yes RLE Weight Bearing: Weight bearing as tolerated RLE Partial Weight Bearing Percentage or Pounds: 100%      Mobility  Bed Mobility Overal bed mobility: Needs Assistance Bed Mobility: Supine to Sit     Supine to sit: Min assist     General bed mobility comments: min A to RLE,  vc's for preventing hip flexion past 90. In [place], has difficulty scooting to EOB because can't hook R knee. Min A to scoot.   Transfers Overall transfer level: Needs assistance Equipment used: Rolling walker (2 wheeled) Transfers: Sit to/from Stand Sit to Stand: Min guard         General transfer comment: min-guard A for safety  Ambulation/Gait Ambulation/Gait assistance: Min guard Gait Distance (Feet): 20 Feet Assistive device: Rolling walker (2 wheeled) Gait Pattern/deviations: Step-to pattern;Decreased weight shift to right;Decreased stance time - right Gait velocity: decreased Gait velocity interpretation: <1.31 ft/sec, indicative of household ambulator General Gait Details: ambulation distance limited by not having L ankle brace. Safe with use of RW  Stairs Stairs: Yes Stairs assistance: Min assist Stair Management: One rail Right;Step to pattern;Forwards;Backwards Number of Stairs: 2 General stair comments: pt ascended fwd, descended bkwd. Usually steps up with RLE first so had to change this. Was safe with rail and HHA  Wheelchair Mobility    Modified Rankin (Stroke Patients Only)       Balance Overall balance assessment: Needs assistance Sitting-balance support: No upper extremity supported Sitting balance-Leahy Scale: Fair     Standing balance support: No upper extremity supported Standing balance-Leahy Scale: Fair Standing balance comment: able to maintain static standing without UE support but needs support for safety with movement                             Pertinent Vitals/Pain Pain Assessment:  No/denies pain    Home Living Family/patient expects to be discharged to:: Private residence Living Arrangements: Spouse/significant other Available Help at Discharge: Family;Available 24 hours/day Type of Home: House Home Access: Stairs to enter Entrance Stairs-Rails: Right Entrance Stairs-Number of Steps: 2 Home Layout: One level Home  Equipment: Walker - standard;Walker - 4 wheels;Cane - single point;Wheelchair - [employer];Shower seat Additional Comments: pt usually sponge bathes. Lives with husband but he recently had back surgery so cannot really assist her more than min. Have friends that can help with groceries and driving    Prior Function Level of Independence: Needs assistance   Gait / Transfers Assistance Needed: fell 7 weeks ago and walked with SW for 2 weeks and had stopped using since then. Husband gives HHA up 2 steps           Hand Dominance   Dominant Hand: Right    Extremity/Trunk Assessment   Upper Extremity Assessment Upper Extremity Assessment: Generalized weakness(arthritis)    Lower Extremity Assessment Lower Extremity Assessment: Generalized weakness;LLE deficits/detail;RLE deficits/detail RLE Deficits / Details: hip flex 2/5, hip abd 2/5 RLE Sensation: WNL RLE Coordination: WNL LLE Deficits / Details: arthritic changes L ankle, usually wears brace for mobility and not present on eval. LE >3/5 throughout LLE Sensation: WNL LLE Coordination: WNL    Cervical / Trunk Assessment Cervical / Trunk Assessment: Other exceptions(scoliotic curve)  Communication   Communication: No difficulties  Cognition Arousal/Alertness: Awake/alert Behavior During Therapy: WFL for tasks assessed/performed Overall Cognitive Status: Within Functional Limits for tasks assessed                                        General Comments General comments (skin integrity, edema, etc.): pt's ambulation would be more comfortable for her if she could bend R knee but at this time MD wants her to wear KI at all times    Exercises General Exercises - Lower Extremity Ankle Circles/Pumps: AROM;Both;10 reps;Supine Quad Sets: AROM;Both;10 reps;Supine Hip ABduction/ADduction: AAROM;Right;10 reps;Supine   Assessment/Plan    PT Assessment All further PT needs can be met in the next venue of  care  PT Problem List Decreased strength;Decreased range of motion;Decreased activity tolerance;Decreased balance;Decreased mobility;Pain       PT Treatment Interventions DME instruction;Gait training;Stair training;Functional mobility training;Therapeutic activities;Therapeutic exercise;Balance training;Patient/family education    PT Goals (Current goals can be found in the Care Plan section)  Acute Rehab PT Goals Patient Stated Goal: return home PT Goal Formulation: All assessment and education complete, DC therapy    Frequency     Barriers to discharge        Co-evaluation               AM-PAC PT "6 Clicks" Mobility  Outcome Measure Help needed turning from your back to your side while in a flat bed without using bedrails?: A Little Help needed moving from lying on your back to sitting on the side of a flat bed without using bedrails?: A Little Help needed moving to and from a bed to a chair (including a wheelchair)?: A Little Help needed standing up from a chair using your arms (e.g., wheelchair or bedside chair)?: A Little Help needed to walk in hospital room?: A Little Help needed climbing 3-5 steps with a railing? : A Little 6 Click Score: 18    End of Session Equipment Utilized During Treatment: Gait belt;Right knee  immobilizer Activity Tolerance: Patient tolerated treatment well Patient left: in chair;with call bell/phone within reach Nurse Communication: Mobility status PT Visit Diagnosis: Unsteadiness on feet (R26.81);Muscle weakness (generalized) (M62.81);Difficulty in walking, not elsewhere classified (R26.2)    Time: 6751-9824 PT Time Calculation (min) (ACUTE ONLY): 43 min   Charges:   PT Evaluation $PT Eval Moderate Complexity: 1 Mod PT Treatments $Gait Training: 8-22 mins $Therapeutic Activity: 8-22 mins        Leighton Roach, [place]  Pager [phone] Office [place] 05/03/2019, 1:01 PM

## 2019-05-03 NOTE — Op Note (Signed)
NAME: [name], Crystal [name] [facility] MEDICAL RECORD [id number] ACCOUNT [id number] DATE OF BIRTH:12-07-1950 FACILITY: [facility] LOCATION: [facility] PHYSICIAN:Lynley Killilea Fransico Setters, MD  OPERATIVE REPORT  DATE OF PROCEDURE:  05/02/2019  SURGEON:  Latanya Maudlin, MD  ASSISTANT:  Ardeen Jourdain, PA  PREOPERATIVE DIAGNOSES:  Posterior dislocation of a total hip closed type.  This was secondary to her bending over.  POSTOPERATIVE DIAGNOSES:   Posterior dislocation of a total hip closed type.  This was secondary to her bending over.  OPERATION:  Closed reduction of a posterior dislocated right total hip under general anesthesia in the operating room.  PROCEDURE IN DETAIL:  The appropriate timeout was first carried out.  I also marked the appropriate right hip in the holding area.  At this time, after a good general anesthetic, I did a gentle closed manipulation of the right total hip and reduced it  quite nicely.  The leg lengths were equal when we were finished.  An AP x-ray showed anatomical reduction.  [name] was Licensed conveyancer.  At this time, she was placed in a knee immobilizer.  She will be admitted overnight so that she can have  therapy here in the morning.  LN/NUANCE  D:05/02/2019 T:05/02/2019 JOB:[id number]

## 2019-05-03 NOTE — Progress Notes (Signed)
Subjective: 1 Day Post-Op Procedure(s) (LRB): CLOSED REDUCTION HIP (Right) Patient reports pain as 1 on 0-10 scale.Doing well. Hip is well-located. Will DC after PT.    Objective: Vital signs in last 24 hours: Temp:  [97.3 F (36.3 C)-98.4 F (36.9 C)] 98.1 F (36.7 C) (06/08 0444) Pulse Rate:  [79-102] 79 (06/08 0444) Resp:  [17-20] 17 (06/08 0444) BP: (112-165)/(62-118) 112/62 (06/08 0444) SpO2:  [95 %-99 %] 95 % (06/08 0444) Weight:  [54 kg] 54 kg (06/07 1354)  Intake/Output from previous day: 06/07 0701 - 06/08 0700 In: 424.4 [I.V.:424.4] Out: 400 [Urine:400] Intake/Output this shift: No intake/output data recorded.  Recent Labs    05/02/19 1508  HGB 12.2   Recent Labs    05/02/19 1508  WBC 6.7  RBC 4.03  HCT 38.1  PLT 300   Recent Labs    05/02/19 1508  NA 139  K 3.8  CL 98  CO2 28  BUN 11  CREATININE 0.39*  GLUCOSE 110*  CALCIUM 9.1   No results for input(s): LABPT, INR in the last 72 hours.  Dorsiflexion/Plantar flexion intact   Assessment/Plan: 1 Day Post-Op Procedure(s) (LRB): CLOSED REDUCTION HIP (Right) Up with therapy   DC after PT  Latanya Maudlin 05/03/2019, 7:11 AM

## 2019-05-09 NOTE — Discharge Summary (Signed)
Physician Discharge Summary   Patient ID: Crystal [name] MRN: [id number] DOB/AGE: Jun 04, 1951 69 y.o.  Admit date: 05/02/2019 Discharge date: 05/09/2019  Primary Diagnosis:   Dislocated right total hip  Admission Diagnoses:  Past Medical History:  Diagnosis Date   Asthma    Collagen vascular disease (HCC)    Cystocele    2nd degree   Enterocele    early   Gallstones 11/2018   Muscle inflammation    Rectocele    mild   Rheumatoid arthritis (HCC)    Vaginal atrophy    Discharge Diagnoses:   Principal Problem:   Hip dislocation, right, initial encounter ([facility]) Active Problems:   Arthritis or polyarthritis, rheumatoid (H[facility])   Adult hypothyroidism  Procedure:  Procedure(s) (LRB): CLOSED REDUCTION HIP (Right)   Consults: None  HPI: Patient reported to the ED after a right total hip dislocation at home. She was transported to the OR for closed reduction.      Laboratory Data: Hospital Outpatient Visit on 05/01/2018  Component Date Value Ref Range Status   Creatinine, Ser 05/01/2018 0.40* 0.44 - 1.00 mg/dL Final    X-Rays:Dg Hip Port Unilat With Pelvis 1v Right  Result Date: 05/02/2019 CLINICAL DATA:  Closed reduction of right hip. EXAM: DG HIP (WITH OR WITHOUT PELVIS) 1V PORT RIGHT COMPARISON:  Earlier today at 1356 hours. FINDINGS: Single portable over penetrated radiograph demonstrates apparent relocation of the femoral component of total hip arthroplasty. The previously question proximal right femur fracture is not well evaluated due to limitations of this radiograph. IMPRESSION: Intraoperative imaging of relocation of right hip arthroplasty. Electronically Signed   By: Jeronimo Greaves M.D.   On: 05/02/2019 18:59   Dg Hip Port Unilat W Or [place] Pelvis 1 View Right  Result Date: 05/02/2019 CLINICAL DATA:  Heard pop from right hip after bending over to wash foot. History of hip surgery and right hip dislocation. EXAM: DG HIP (WITH OR WITHOUT PELVIS) 1V PORT  RIGHT COMPARISON:  03/14/2019 FINDINGS: Examination demonstrates bilateral total hip arthroplasties. There is dislocation of the femoral component of the right hip prosthesis superolateral to the acetabular component. There is diffuse osteopenia. Findings suggesting a fracture along the lateral cortex of the subtrochanteric region of the right femur. IMPRESSION: Dislocated right femoral component of the hip prosthesis. Suggestion of fracture along the lateral aspect of the right femoral subtrochanteric region. Electronically Signed   By: Elberta Fortis M.D.   On: 05/02/2019 15:10    EKG: Orders placed or performed in visit on 07/05/08   EKG 12-Lead     Hospital Course: Patient was admitted to [facility] and taken to the OR and underwent the above state procedure without complications.  Patient tolerated the procedure well and was later transferred to the recovery room and then to the orthopaedic floor for postoperative care.  They were given PO and IV analgesics for pain control following their surgery.   PT was consulted postop to assist with mobility and transfers.  The patient was allowed to be WBAT with therapy and required to wear a knee immobilizer.   Patient had a good night on the evening of surgery and started to get up OOB with therapy on day one. Patient was seen in rounds and was ready to go home on day one.   They were instructed on when to follow up in the office with Dr. Lequita Halt.   Diet: Cardiac diet Activity:WBAT Follow-up:in 2 weeks Disposition - Home Discharged Condition: stable   Discharge  Instructions    Call MD / Call 911   Complete by: As directed    If you experience chest pain or shortness of breath, CALL 911 and be transported to the hospital emergency room.  If you develope a fever above 101 F, pus (white drainage) or increased drainage or redness at the wound, or calf pain, call your surgeon's office.   Constipation Prevention   Complete by: As directed     Drink plenty of fluids.  Prune juice may be helpful.  You may use a stool softener, such as Colace (over the counter) 100 mg twice a day.  Use MiraLax (over the counter) for constipation as needed.   Diet - low sodium heart healthy   Complete by: As directed    Discharge instructions   Complete by: As directed    Weightbearing as tolerated with walker Must wear knee immobilizer Follow up in office to see Dr. Lequita Halt in 2 weeks   Weight bearing as tolerated   Complete by: As directed    MUST wear knee immobilizer on right knee     Allergies as of 05/03/2019      Reactions   B12-c-fa-fe [iron-vit C-vit B12-folic Acid] Shortness Of Breath   Sulfa Antibiotics Other (See Comments)   Caused skin to come off in her mouth and down her throat.     Vitamin B12 Anaphylaxis   When she takes a *HIGH DOSE*   Alendronate Sodium    Other reaction(s): Other (See Comments) RECTAL BLEEDING   Demerol [meperidine] Nausea Only   Diclofenac    Other reaction(s): Other (See Comments) MOUTH ULCERS   Humira [adalimumab] Swelling   brain   Hydrocodone-acetaminophen Nausea Only   Ibandronic Acid Other (See Comments)   CONSTIPATION Other reaction(s): Other (See Comments) CONSTIPATION   Morphine And Related Nausea And Vomiting   Nalfon [fenoprofen Calcium] Hives   Remicade [infliximab]    Drug induce lupus   Sulfasalazine Other (See Comments)   Ulcer   Imuran [azathioprine] Rash   Oxaprozin Rash   Oxcarbazepine Rash   Skin peeling, like a sunburn      Medication List    TAKE these medications   aspirin EC 81 MG tablet Take 81 mg by mouth daily.   CALTRATE 600 PLUS-VIT D PO Take 1 tablet by mouth daily.   Enbrel 25 MG injection Generic drug: etanercept Inject 25 mg into the skin once a week.   etodolac 500 MG tablet Commonly known as: LODINE Take 500 mg by mouth 2 (two) times daily.   ferrous sulfate 325 (65 FE) MG tablet Take 325 mg by mouth daily with breakfast.   Fish Oil 1000 MG  Caps Take 1 capsule by mouth daily.   hydrochlorothiazide 25 MG tablet Commonly known as: HYDRODIURIL Take 25 mg by mouth daily.   methotrexate 2.5 MG tablet Commonly known as: RHEUMATREX Take 10 mg by mouth once a week.   Multi-Vitamins Tabs Take 1 tablet by mouth daily.   omeprazole 20 MG capsule Commonly known as: PRILOSEC Take 20 mg by mouth daily.   predniSONE 5 MG tablet Commonly known as: DELTASONE Take 5 mg by mouth daily.   PRESERVISION AREDS 2 PO Take 1 tablet by mouth daily.   ProAir HFA 108 (90 Base) MCG/ACT inhaler Generic drug: albuterol Inhale into the lungs.   promethazine 25 MG tablet Commonly known as: PHENERGAN Take 12.5-25 mg by mouth every 6 (six) hours as needed for nausea or vomiting.   RA  Vitamin B-12 TR 1000 MCG Tbcr Generic drug: Cyanocobalamin Take 1 tablet by mouth daily.   Synthroid 100 MCG tablet Generic drug: levothyroxine Take 100 mcg by mouth daily.   traMADol 50 MG tablet Commonly known as: ULTRAM Take 50 mg by mouth 2 (two) times daily as needed (pain).   vitamin E 400 UNIT capsule Take 400 Units by mouth daily.            Discharge Care Instructions  (From admission, onward)         Start     Ordered   05/03/19 0000  Weight bearing as tolerated    Comments: MUST wear knee immobilizer on right knee   05/03/19 1165         Follow-up Information    Gaynelle Arabian, MD. Schedule an appointment as soon as possible for a visit in 2 week(s).   Specialty: Orthopedic Surgery Contact information: [address] [place] [address] [phone]           Signed: Ardeen Jourdain, PA-C Orthopaedic Surgery 05/09/2019, 7:37 AM

## 2019-05-28 NOTE — Progress Notes (Signed)
Chart reviewed; B Jawan Chavarria RN,MHA,BSN  Advance Care Supervisor [phone] 

## 2019-07-21 ENCOUNTER — Ambulatory Visit
Admission: RE | Admit: 2019-07-21 | Discharge: 2019-07-21 | Disposition: A | Discharge status: Home / Self Care | Payer: Medicare Other | Source: Ambulatory Visit | Attending: Family Medicine | Admitting: Family Medicine

## 2019-07-21 DIAGNOSIS — Z1231 Encounter for screening mammogram for malignant neoplasm of breast: Secondary | ICD-10-CM | POA: Diagnosis not present

## 2019-07-28 ENCOUNTER — Encounter: Payer: Medicare Other | Admitting: Obstetrics and Gynecology

## 2019-09-02 ENCOUNTER — Encounter: Payer: Medicare Other | Admitting: Obstetrics and Gynecology

## 2019-09-14 ENCOUNTER — Encounter: Payer: Self-pay | Admitting: Obstetrics and Gynecology

## 2019-09-14 ENCOUNTER — Other Ambulatory Visit: Payer: Self-pay

## 2019-09-14 ENCOUNTER — Ambulatory Visit (INDEPENDENT_AMBULATORY_CARE_PROVIDER_SITE_OTHER): Payer: Medicare Other | Admitting: Obstetrics and Gynecology

## 2019-09-14 VITALS — BP 124/84 | HR 82 | Wt 120.9 lb

## 2019-09-14 DIAGNOSIS — N8111 Cystocele, midline: Secondary | ICD-10-CM | POA: Diagnosis not present

## 2019-09-14 DIAGNOSIS — N8189 Other female genital prolapse: Secondary | ICD-10-CM | POA: Diagnosis not present

## 2019-09-14 NOTE — Progress Notes (Signed)
HPI:      Crystal [name] is a 68 y.o. [phone] who LMP was No LMP recorded. Patient has had a hysterectomy.  Subjective:   She presents today for follow-up of pelvic relaxation.  Patient has had a previous hysterectomy and a previous cystocele repair.  She states that she believes her cystocele is worsening but she remains asymptomatic without urinary leakage.  She has no pelvic complaints.  Patient does complain that in the last year her previously repaired right hip dislocated and she has some concerns regarding this and is still recovering.  Patient has a very strong history of breast cancer in her family but has tested BRCA negative    Hx: The following portions of the patient's history were reviewed and updated as appropriate:             She  has a past medical history of Asthma, Collagen vascular disease ([place]), Cystocele, Enterocele, Gallstones (11/2018), Muscle inflammation, Rectocele, Rheumatoid arthritis ([place]), and Vaginal atrophy. She does not have any pertinent problems on file. She  has a past surgical history that includes Total hip arthroplasty (Bilateral, [id number]); hand surgery (2002); Foot surgery (Bilateral, 1993); knee replacement (Bilateral, 1993); neck fusion (2000); Thyroid surgery (1999); Shoulder arthroscopy (2001); Anterior and posterior vaginal repair (2009); Abdominal hysterectomy (1980); Carpal tunnel release (Left); Wrist surgery (Left, 1986); Temporomandibular joint arthroplasty (1991); Elbow surgery; and Hip Closed Reduction (Right, 05/02/2019). Her family history includes Breast cancer in her cousin and maternal aunt; Breast cancer (age of onset: 21) in her paternal aunt; Breast cancer (age of onset: 69) in her maternal aunt; Colon cancer in her cousin; Diabetes in her brother, mother, and paternal aunt. She  reports that she has never smoked. She has never used smokeless tobacco. She reports that she does not drink alcohol or use drugs. She has a current  medication list which includes the following prescription(s): albuterol, aspirin ec, calcium-vitamin d, cyanocobalamin, etanercept, etodolac, ferrous sulfate, hydrochlorothiazide, methotrexate, multi-vitamins, multiple vitamins-minerals, fish oil, omeprazole, prednisone, promethazine, synthroid, tramadol, and vitamin e. She is allergic to b12-c-fa-fe [iron-vit c-vit b12-folic acid]; sulfa antibiotics; vitamin b12; alendronate sodium; demerol [meperidine]; diclofenac; humira [adalimumab]; hydrocodone-acetaminophen; ibandronic acid; morphine and related; nalfon [fenoprofen calcium]; remicade [infliximab]; sulfasalazine; imuran [azathioprine]; oxaprozin; and oxcarbazepine.       Review of Systems:  Review of Systems  Constitutional: Denied constitutional symptoms, night sweats, recent illness, fatigue, fever, insomnia and weight loss.  Eyes: Denied eye symptoms, eye pain, photophobia, vision change and visual disturbance.  Ears/Nose/Throat/Neck: Denied ear, nose, throat or neck symptoms, hearing loss, nasal discharge, sinus congestion and sore throat.  Cardiovascular: Denied cardiovascular symptoms, arrhythmia, chest pain/pressure, edema, exercise intolerance, orthopnea and palpitations.  Respiratory: Denied pulmonary symptoms, asthma, pleuritic pain, productive sputum, cough, dyspnea and wheezing.  Gastrointestinal: Denied, gastro-esophageal reflux, melena, nausea and vomiting.  Genitourinary: See HPI for additional information.  Musculoskeletal: Denied musculoskeletal symptoms, stiffness, swelling, muscle weakness and myalgia.  Dermatologic: Denied dermatology symptoms, rash and scar.  Neurologic: Denied neurology symptoms, dizziness, headache, neck pain and syncope.  Psychiatric: Denied psychiatric symptoms, anxiety and depression.  Endocrine: Denied endocrine symptoms including hot flashes and night sweats.   Meds:   Current Outpatient Medications on File Prior to Visit  Medication Sig  Dispense Refill  . albuterol (PROAIR HFA) 108 (90 BASE) MCG/ACT inhaler Inhale into the lungs.    Marland Kitchen aspirin EC 81 MG tablet Take 81 mg by mouth daily.     . Calcium-Vitamin D (CALTRATE 600 PLUS-VIT D PO)  Take 1 tablet by mouth daily.     . Cyanocobalamin (RA VITAMIN B-12 TR) 1000 MCG TBCR Take 1 tablet by mouth daily.     . etanercept (ENBREL) 25 MG injection Inject 25 mg into the skin once a week.     . etodolac (LODINE) 500 MG tablet Take 500 mg by mouth 2 (two) times daily.     . ferrous sulfate 325 (65 FE) MG tablet Take 325 mg by mouth daily with breakfast.    . hydrochlorothiazide (HYDRODIURIL) 25 MG tablet Take 25 mg by mouth daily.    . methotrexate (RHEUMATREX) 2.5 MG tablet Take 10 mg by mouth once a week.     . Multiple Vitamin (MULTI-VITAMINS) TABS Take 1 tablet by mouth daily.     . Multiple Vitamins-Minerals (PRESERVISION AREDS 2 PO) Take 1 tablet by mouth daily.     . Omega-3 Fatty Acids (FISH OIL) 1000 MG CAPS Take 1 capsule by mouth daily.     . omeprazole (PRILOSEC) 20 MG capsule Take 20 mg by mouth daily.     . predniSONE (DELTASONE) 5 MG tablet Take 2.5 mg by mouth daily.     . promethazine (PHENERGAN) 25 MG tablet Take 12.5-25 mg by mouth every 6 (six) hours as needed for nausea or vomiting.     . SYNTHROID 100 MCG tablet Take 100 mcg by mouth daily.    . traMADol (ULTRAM) 50 MG tablet Take 50 mg by mouth 2 (two) times daily as needed (pain).     . vitamin E 400 UNIT capsule Take 400 Units by mouth daily.      No current facility-administered medications on file prior to visit.     Objective:     Vitals:   09/14/19 1055  BP: 124/84  Pulse: 82            Physical examination   Pelvic:   Vulva: Normal appearance.  No lesions.  Vagina: No lesions or abnormalities noted.  Support:  Third-degree cystocele-second-degree rectocele some vaginal cuff relaxation as well.  Urethra No masses tenderness or scarring.  Meatus Normal size without lesions or prolapse.   Cervix:  Surgically absent  Anus: Normal exam.  No lesions.  Perineum: Normal exam.  No lesions.        Bimanual   Uterus:  Surgically absent  Adnexae: No masses.  Non-tender to palpation.  Cul-de-sac: Negative for abnormality.     Assessment:    G2P2002 Patient Active Problem List   Diagnosis Date Noted  . Hip dislocation, right, initial encounter (HCC) 05/02/2019  . Family history of breast cancer 07/23/2018  . 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 B12 deficiency 07/12/2015  . Borderline blood pressure 07/12/2015  . Midline cystocele 07/12/2015  . Osteopenia 07/12/2015  . Arthritis or polyarthritis, rheumatoid (HCC) 07/12/2015  . Near syncope 07/12/2015  . Thyroid nodule 07/12/2015  . History of melena 09/20/2014  . Pain in rectum 09/20/2014  . Adult hypothyroidism 08/12/2014  . History of implantation of joint prosthesis of elbow 05/11/2014  . Ankle pain 03/16/2012  . Rheumatoid arthritis of foot (HCC) 03/16/2012  . History of artificial joint 03/13/2012     1. Midline cystocele   2. Pelvic relaxation disorder     Although patient has noticed her cystocele worsening she remains asymptomatic.   Plan:            1.  Discussed cystocele rectocele etc. in detail.  All questions answered. Orders No   orders of the defined types were placed in this encounter.   No orders of the defined types were placed in this encounter.     F/U  Return in about 1 year (around 09/13/2020). I spent 17 minutes involved in the care of this patient of which greater than 50% was spent discussing cystocele rectocele pelvic prolapse.  Future follow-ups.  Finis Bud, M.D. 09/14/2019 11:25 AM

## 2019-09-14 NOTE — Progress Notes (Signed)
Patient comes in today for yearly follow up on Cystocele.

## 2019-11-07 ENCOUNTER — Other Ambulatory Visit: Payer: Self-pay

## 2019-11-07 ENCOUNTER — Emergency Department: Payer: Medicare Other

## 2019-11-07 ENCOUNTER — Emergency Department
Admission: EM | Admit: 2019-11-07 | Discharge: 2019-11-07 | Disposition: A | Discharge status: Home / Self Care | Payer: Medicare Other | Attending: Emergency Medicine | Admitting: Emergency Medicine

## 2019-11-07 DIAGNOSIS — Y929 Unspecified place or not applicable: Secondary | ICD-10-CM | POA: Diagnosis not present

## 2019-11-07 DIAGNOSIS — M069 Rheumatoid arthritis, unspecified: Secondary | ICD-10-CM | POA: Insufficient documentation

## 2019-11-07 DIAGNOSIS — Y999 Unspecified external cause status: Secondary | ICD-10-CM | POA: Insufficient documentation

## 2019-11-07 DIAGNOSIS — X58XXXA Exposure to other specified factors, initial encounter: Secondary | ICD-10-CM | POA: Diagnosis not present

## 2019-11-07 DIAGNOSIS — R200 Anesthesia of skin: Secondary | ICD-10-CM | POA: Diagnosis present

## 2019-11-07 DIAGNOSIS — Z79899 Other long term (current) drug therapy: Secondary | ICD-10-CM | POA: Diagnosis not present

## 2019-11-07 DIAGNOSIS — Y939 Activity, unspecified: Secondary | ICD-10-CM | POA: Insufficient documentation

## 2019-11-07 DIAGNOSIS — S3210XA Unspecified fracture of sacrum, initial encounter for closed fracture: Secondary | ICD-10-CM | POA: Diagnosis not present

## 2019-11-07 DIAGNOSIS — M130 Polyarthritis, unspecified: Secondary | ICD-10-CM | POA: Insufficient documentation

## 2019-11-07 DIAGNOSIS — J45909 Unspecified asthma, uncomplicated: Secondary | ICD-10-CM | POA: Diagnosis not present

## 2019-11-07 DIAGNOSIS — E039 Hypothyroidism, unspecified: Secondary | ICD-10-CM | POA: Insufficient documentation

## 2019-11-07 DIAGNOSIS — M8448XA Pathological fracture, other site, initial encounter for fracture: Secondary | ICD-10-CM

## 2019-11-07 LAB — BASIC METABOLIC PANEL
Anion gap: 11 (ref 5–15)
BUN: 11 mg/dL (ref 8–23)
CO2: 26 mmol/L (ref 22–32)
Calcium: 8.2 mg/dL — ABNORMAL LOW (ref 8.9–10.3)
Chloride: 100 mmol/L (ref 98–111)
Creatinine, Ser: <0.3 mg/dL — ABNORMAL LOW (ref 0.44–1.00)
Glucose, Bld: 96 mg/dL (ref 70–99)
Potassium: 3.6 mmol/L (ref 3.5–5.1)
Sodium: 137 mmol/L (ref 135–145)

## 2019-11-07 LAB — URINALYSIS, COMPLETE (UACMP) WITH MICROSCOPIC
Bacteria, UA: NONE SEEN
Bilirubin Urine: NEGATIVE
Glucose, UA: NEGATIVE mg/dL
Hgb urine dipstick: NEGATIVE
Ketones, ur: NEGATIVE mg/dL
Leukocytes,Ua: NEGATIVE
Nitrite: NEGATIVE
Protein, ur: NEGATIVE mg/dL
Specific Gravity, Urine: 1.011 (ref 1.005–1.030)
pH: 7 (ref 5.0–8.0)

## 2019-11-07 LAB — MAGNESIUM: Magnesium: 2.5 mg/dL — ABNORMAL HIGH (ref 1.7–2.4)

## 2019-11-07 LAB — CBC
HCT: 35.2 % — ABNORMAL LOW (ref 36.0–46.0)
Hemoglobin: 12.1 g/dL (ref 12.0–15.0)
MCH: 30.3 pg (ref 26.0–34.0)
MCHC: 34.4 g/dL (ref 30.0–36.0)
MCV: 88.2 fL (ref 80.0–100.0)
Platelets: 322 10*3/uL (ref 150–400)
RBC: 3.99 MIL/uL (ref 3.87–5.11)
RDW: 14.6 % (ref 11.5–15.5)
WBC: 7.3 10*3/uL (ref 4.0–10.5)
nRBC: 0 % (ref 0.0–0.2)

## 2019-11-07 LAB — SEDIMENTATION RATE: Sed Rate: 17 mm/hr (ref 0–30)

## 2019-11-07 MED ORDER — TRAMADOL HCL 50 MG PO TABS
100.0000 mg | ORAL_TABLET | ORAL | Status: AC
  Administered 2019-11-07: 100 mg via ORAL
  Filled 2019-11-07: qty 2

## 2019-11-07 NOTE — ED Notes (Signed)
Pt taken to MRI vis stretcher.

## 2019-11-07 NOTE — ED Notes (Signed)
Pt given phone to call husband 

## 2019-11-07 NOTE — ED Notes (Signed)
Pt talked to MRI on the phone for screening questions.

## 2019-11-07 NOTE — ED Triage Notes (Signed)
Pt arrives [facility] from home for intermittent numbness to bilat legs that started Friday. Pt states she has shuffling gait d/t numbness. EMS states feels touch to legs. C/o low back pain.  hx RA, neck fusion started on prednisone d/t poss inflammation VSS 150/72, 90 HR, 98% RA Arrives [facility]&O, speaking in complete sentences.

## 2019-11-07 NOTE — ED Notes (Signed)
Sheets replaced and new chux applied, pt repositioned for comfort.

## 2019-11-07 NOTE — ED Provider Notes (Signed)
[facility] Emergency Department Provider Note   ____________________________________________   First MD Initiated Contact with Patient 11/07/19 [id number]     (approximate)  I have reviewed the triage vital signs and the nursing notes.   HISTORY  Chief Complaint Numbness    HPI Crystal [name] is a 68 y.o. female history of rheumatoid arthritis, polyarthritis  Patient reports that for the last few days she has had a pain in her very low back.  She reports it started few days ago and felt like someone had suddenly struck her in her very low back.  Is been ongoing discomfort since then but she is also noticed that she is getting intermittent numbing sensation and sometimes she is having difficulty walking, today in particular she reports the pain in the low back was causing her difficulty walking  No fevers or chills.  No nausea vomiting.  No recent illness except she did have a bad fall back in April, also reports that she had issues with her right hip a few months after that  She had a previous history of spinal surgery including cervical  She has had no falls or injuries with this pain.  She has had no fever.  She is otherwise been healthy, reports that she is not actively having numbness in her legs, but was until the paramedics came and picked her up.  Now she is just having pain.  She takes tramadol up to 100 mg at a time at home, and does not wish anything stronger at this time.  She would like a dose of tramadol for pain, and reports that it is a prescription she already has at home but has not yet had today   Past Medical History:  Diagnosis Date  . Asthma   . Collagen vascular disease (HCC)   . Cystocele    2nd degree  . Enterocele    early  . Gallstones 11/2018  . Muscle inflammation   . Rectocele    mild  . Rheumatoid arthritis (HCC)   . Vaginal atrophy     Patient Active Problem List   Diagnosis Date Noted  . Hip dislocation,  right, initial encounter (HCC) 05/02/2019  . Family history of breast cancer 07/23/2018  . 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 B12 deficiency 07/12/2015  . Borderline blood pressure 07/12/2015  . Midline cystocele 07/12/2015  . Osteopenia 07/12/2015  . Arthritis or polyarthritis, rheumatoid (HCC) 07/12/2015  . Near syncope 07/12/2015  . Thyroid nodule 07/12/2015  . History of melena 09/20/2014  . Pain in rectum 09/20/2014  . Adult hypothyroidism 08/12/2014  . History of implantation of joint prosthesis of elbow 05/11/2014  . Ankle pain 03/16/2012  . Rheumatoid arthritis of foot (HCC) 03/16/2012  . History of artificial joint 03/13/2012    Past Surgical History:  Procedure Laterality Date  . ABDOMINAL HYSTERECTOMY  1980  . ANTERIOR AND POSTERIOR VAGINAL REPAIR  2009   enterocele ligation  . CARPAL TUNNEL RELEASE Left    1982  . ELBOW SURGERY    . FOOT SURGERY Bilateral 1993  . hand surgery  2002   7 joint implants  . HIP CLOSED REDUCTION Right 05/02/2019   Procedure: CLOSED REDUCTION HIP;  Surgeon: Ranee Gosselin, MD;  Location: [facility] OR;  Service: Orthopedics;  Laterality: Right;  . knee replacement Bilateral 1993  . neck fusion  2000  . SHOULDER ARTHROSCOPY  2001  . TEMPOROMANDIBULAR JOINT ARTHROPLASTY  1991  . THYROID  SURGERY  1999  . TOTAL HIP ARTHROPLASTY Bilateral [id number]  . WRIST SURGERY Left 1986    Prior to Admission medications   Medication Sig Start Date End Date Taking? Authorizing Provider  albuterol (PROAIR HFA) 108 (90 BASE) MCG/ACT inhaler Inhale into the lungs. 08/12/14   [provider]  aspirin EC 81 MG tablet Take 81 mg by mouth daily.     [provider]  Calcium-Vitamin D (CALTRATE 600 PLUS-VIT D PO) Take 1 tablet by mouth daily.     [provider]  Cyanocobalamin (RA VITAMIN B-12 TR) 1000 MCG TBCR Take 1 tablet by mouth daily.     [provider]  etanercept (ENBREL) 25  MG injection Inject 25 mg into the skin once a week.     [provider]  etodolac (LODINE) 500 MG tablet Take 500 mg by mouth 2 (two) times daily.  01/17/15   [provider]  ferrous sulfate 325 (65 FE) MG tablet Take 325 mg by mouth daily with breakfast.    [provider]  hydrochlorothiazide (HYDRODIURIL) 25 MG tablet Take 25 mg by mouth daily. 07/13/18   [provider]  methotrexate (RHEUMATREX) 2.5 MG tablet Take 10 mg by mouth once a week.  06/03/16   [provider]  Multiple Vitamin (MULTI-VITAMINS) TABS Take 1 tablet by mouth daily.     [provider]  Multiple Vitamins-Minerals (PRESERVISION AREDS 2 PO) Take 1 tablet by mouth daily.     [provider]  Omega-3 Fatty Acids (FISH OIL) 1000 MG CAPS Take 1 capsule by mouth daily.     [provider]  omeprazole (PRILOSEC) 20 MG capsule Take 20 mg by mouth daily.  11/07/14   [provider]  predniSONE (DELTASONE) 5 MG tablet Take 2.5 mg by mouth daily.  02/21/15   [provider]  promethazine (PHENERGAN) 25 MG tablet Take 12.5-25 mg by mouth every 6 (six) hours as needed for nausea or vomiting.  06/15/15   [provider]  SYNTHROID 100 MCG tablet Take 100 mcg by mouth daily. 03/17/19   [provider]  traMADol (ULTRAM) 50 MG tablet Take 50 mg by mouth 2 (two) times daily as needed (pain).  07/17/17   [provider]  vitamin E 400 UNIT capsule Take 400 Units by mouth daily.     [provider]    Allergies B12-c-fa-fe [iron-vit c-vit b12-folic acid], Sulfa antibiotics, Vitamin b12, Alendronate sodium, Demerol [meperidine], Diclofenac, Humira [adalimumab], Hydrocodone-acetaminophen, Ibandronic acid, Morphine and related, Nalfon [fenoprofen calcium], Remicade [infliximab], Sulfasalazine, Imuran [azathioprine], Oxaprozin, and Oxcarbazepine  Family History  Problem Relation Age of Onset  . Breast cancer Maternal Aunt          32  . Breast cancer Paternal Aunt 5150  . Diabetes Paternal Aunt   . Breast cancer Cousin        3 maternal cousins, <50  . Colon cancer Cousin   . Diabetes Mother   . Diabetes Brother   . Breast cancer Maternal Aunt 61  . Ovarian cancer Neg Hx     Social History Social History   Tobacco Use  . Smoking status: Never Smoker  . Smokeless tobacco: Never Used  Substance Use Topics  . Alcohol use: No    Alcohol/week: 0.0 standard drinks  . Drug use: No    Review of Systems Constitutional: No fever/chills Eyes: No visual changes. ENT: No sore throat. Cardiovascular: Denies chest pain. Respiratory: Denies shortness of breath. Gastrointestinal:  No abdominal pain.   Genitourinary: Negative for dysuria. Musculoskeletal: No pain in her neck or mid back, all of her pain discomfort located in the very low part of her back.  Denies weakness in the muscles of the legs.  Currently not having numbness but was earlier feels like a numb or tingling sensation in both lower legs coming from her low back when it occurs. Skin: Negative for rash. Neurological: Negative for headaches or focal weakness.    ____________________________________________   PHYSICAL EXAM:  VITAL SIGNS: ED Triage Vitals  Enc Vitals Group     BP 11/07/19 0725 132/68     Pulse Rate 11/07/19 0725 93     Resp 11/07/19 0725 18     Temp 11/07/19 0725 98.2 F (36.8 C)     Temp Source 11/07/19 0725 Oral     SpO2 11/07/19 0725 99 %     Weight 11/07/19 0726 117 lb (53.1 kg)     Height 11/07/19 0726 5\' 3"  (1.6 m)     Head Circumference --      Peak Flow --      Pain Score 11/07/19 0726 6     Pain Loc --      Pain Edu? --      Excl. in GC? --     Constitutional: Alert and oriented. Well appearing and in no acute distress.  She is very pleasant. Eyes: Conjunctivae are normal. Head: Atraumatic. Nose: No congestion/rhinnorhea. Mouth/Throat: Mucous membranes are moist. Neck: No stridor.  Cardiovascular:  Normal rate, regular rhythm. Grossly normal heart sounds.  Good peripheral circulation. Respiratory: Normal respiratory effort.  No retractions. Lungs CTAB. Gastrointestinal: Soft and nontender. No distention. Musculoskeletal: Old surgical scar.  No cervical or thoracic tenderness.  No tenderness in the upper lumbar region, patient reports moderate midline tenderness over the very low lumbar region without overlying lesion or deformity.  RIGHT Right upper extremity demonstrates normal strength, good use of all muscles. No edema bruising or contusions of the right shoulder/upper arm, right elbow, right forearm / hand. Full range of motion of the right right upper extremity without pain. No evidence of trauma.  Obvious arthritic changes  LEFT Left upper extremity demonstrates normal strength, good use of all muscles. No edema bruising or contusions of the left shoulder/upper arm, left elbow, left forearm / hand. Full range of motion of the left  upper extremity without pain. No evidence of trauma.  Obvious arthritic changes  Lower Extremities  No edema. Normal DP/PT pulses bilateral with good cap refill.  Normal neuro-motor function lower extremities bilateral however, of note when she does in particular flex and extend at the hip on both sides she reports this exacerbates the pain in her lower back limiting her range of motion of both lower legs.  She has strong 5 out of 5 plantar and dorsiflexion at the ankles, normal flexion extension at the knees, but reports significant pain in her lower back pointing towards her very low lumbar region when she flexes or extends the hips.  RIGHT Right lower extremity demonstrates normal strength, good use of all muscles except as limited by pain. No edema bruising or contusions of the right hip, right knee, right ankle. Full range of motion of the right lower extremity without pain. No pain on axial loading. No evidence of trauma.  LEFT Left lower extremity  demonstrates normal strength, good use of all muscles except as limited by pain. No edema bruising or contusions of the hip,  knee,  ankle. Full range of motion of the left lower extremity without pain. No pain on axial loading. No evidence of trauma.  Presently has normal sensation over both lower extremities, but reports earlier when the pain was worse and paramedics were picking her up there was a tingling numbing sensation that is since gone away   Neurologic:  Normal speech and language. No gross focal neurologic deficits are appreciated.  Skin:  Skin is warm, dry and intact. No rash noted. Psychiatric: Mood and affect are normal. Speech and behavior are normal.  ____________________________________________   LABS (all labs ordered are listed, but only abnormal results are displayed)  Labs Reviewed  CBC - Abnormal; Notable for the following components:      Result Value   HCT 35.2 (*)    All other components within normal limits  BASIC METABOLIC PANEL - Abnormal; Notable for the following components:   Creatinine, Ser <0.30 (*)    Calcium 8.2 (*)    All other components within normal limits  URINALYSIS, COMPLETE (UACMP) WITH MICROSCOPIC - Abnormal; Notable for the following components:   Color, Urine YELLOW (*)    APPearance CLEAR (*)    All other components within normal limits  MAGNESIUM - Abnormal; Notable for the following components:   Magnesium 2.5 (*)    All other components within normal limits  SEDIMENTATION RATE   ____________________________________________  EKG  Reviewed entered by me at 732 Heart rate 89 QRS 80 QTc 450 Normal sinus rhythm, no evidence of ischemia or ectopy ____________________________________________  RADIOLOGY  MR LUMBAR SPINE WO CONTRAST  Result Date: 11/07/2019 CLINICAL DATA:  68 year old female with low back pain and difficulty walking. Intermittent numbness radiating to both legs 2 days ago. EXAM: MRI LUMBAR SPINE WITHOUT CONTRAST  TECHNIQUE: Multiplanar, multisequence MR imaging of the lumbar spine was performed. No intravenous contrast was administered. COMPARISON:  CT Abdomen and Pelvis 05/01/2018. FINDINGS: Segmentation: Normal on the comparison CT, although with chronic L5-S1 ankylosis as seen on that exam. Alignment: Stable lumbar lordosis. Grade 1 anterolisthesis of L4 on L5 measures 4-5 millimeters and appears increased since 2019. Vertebrae: No marrow edema in the visible lower thoracic or lumbar spine. But there is patchy marrow edema in the visible bilateral sacral ala, and the central S2 segment. See series 7. There is a greater degree of edema in the left sacral ala which is visible on axial images series 9, image 40. Patchy edema in the lower right ala visible on sagittal images. There is underlying bilateral SI joint ankylosis seen by CT in 2019.Background bone marrow signal throughout the visible spine and pelvis is normal. Conus medullaris and cauda equina: Conus extends to the T12 level. No lower spinal cord or conus signal abnormality. Paraspinal and other soft tissues: Congenital right renal ectopia, borderline pelvic kidney on the right is a normal variant and stable. Negative visible abdominal viscera. Negative posterior paraspinal soft tissues. Disc levels: In general there is mild for age lower thoracic and lumbar spine degeneration. L4-L5: Grade 1 anterolisthesis with disc desiccation and circumferential disc bulge. Left foraminal broad-based disc protrusion with annular fissure (series 8, image 33). Moderate to severe facet hypertrophy greater on the left with degenerative facet joint fluid (same image). Ligament flavum hypertrophy. Mild spinal stenosis with moderate to severe bilateral lateral recess stenosis. Moderate left and mild right L4 foraminal stenosis. L5-S1: Severe chronic disc space loss with evidence of interbody and posterior element ankylosis today and on the prior CT. Endplate spurring but no  significant stenosis. IMPRESSION: 1. Acute to Subacute Sacral Fractures: bilateral ala and S2 sacral body fractures which are minimally displaced. Suspect these are either Insufficiency Fractures, or posttraumatic fractures if any recent fall. 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 2. Chronic L5-S1 ankylosis also. And subsequent moderately advanced lumbar spine degeneration at the adjacent L4-L5 level with spondylolisthesis and multifactorial mild spinal but moderate or severe bilateral lateral recess and foraminal stenosis. 3. Mild for age lumbar spine degeneration elsewhere. Electronically Signed   By: Odessa Fleming M.D.   On: 11/07/2019 10:57    Imaging reviewed, of particular note bilateral ala fractures. ____________________________________________   PROCEDURES  Procedure(s) performed: None  Procedures  Critical Care performed: No  ____________________________________________   INITIAL IMPRESSION / ASSESSMENT AND PLAN / ED COURSE  Pertinent labs & imaging results that were available during my care of the patient were reviewed by me and considered in my medical decision making (see chart for details).   Low back pain.  And atraumatic.  Patient with history of osteoarthritis, rheumatoid, and polyarthritis.  Her pain and discomfort and symptoms all seem to be originating from her very low back.  She does not have any evidence of acute neurologic involvement on clinical exam at this time.  I do not see evidence of a suggest a rapid ascending or descending neurologic process, there is no focal deficits.  Pain seems to be limiting her use of her lower legs.  I suspect in her case our best choice is to first image with MRI of the lumbar region to evaluate for possible bony or spinal etiology.  She is afebrile without infectious symptoms.  Clinical Course as of Nov 07 1135  Sun Nov 07, 2019  0920 Patient reports pain has improved, a little bit of numbness coming back which she  feels is positional, she feels like this improves if she reclines in a reclined head of bed at this time.  She is very pleasant, understanding agreeable with waiting for MRI.  Thus far her lab work including inflammatory marker is very reassuring   [MQ]  1110 DIscussed case, MRI, history with Dr. Odis Luster (Ortho). Can follow-up with ortho clinic this week.    [MQ]    Clinical Course User Index [MQ] Sharyn Creamer, MD   ----------------------------------------- 11:37 AM on 11/07/2019 -----------------------------------------  , Pain is controlled with tramadol.  Family at bedside.  Imaging discussed reviewed clinical history with orthopedics.  Discussed with the patient she is comfortable with discharge has tramadol prescription at home, but will contact Dr. Gavin Potters regarding potentially needing to increase quantity as she is goes through this with sacral alae fractures  Return precautions and treatment recommendations and follow-up discussed with the patient who is agreeable with the plan.  Family with her.  Patient does have assistive devices at home including walker, transfer chair, and feels comfortable with plan to go home with close follow-up  ____________________________________________   FINAL CLINICAL IMPRESSION(S) / ED DIAGNOSES  Final diagnoses:  Sacral insufficiency fracture, initial encounter        Note:  This document was prepared using Dragon voice recognition software and may include unintentional dictation errors       Sharyn Creamer, MD 11/07/19 1137

## 2019-11-07 NOTE — ED Notes (Signed)
Family member at bedside.

## 2020-05-09 ENCOUNTER — Other Ambulatory Visit (HOSPITAL_COMMUNITY): Payer: Self-pay | Admitting: Physician Assistant

## 2020-05-09 ENCOUNTER — Other Ambulatory Visit: Payer: Self-pay | Admitting: Physician Assistant

## 2020-05-09 DIAGNOSIS — M25562 Pain in left knee: Secondary | ICD-10-CM

## 2020-05-19 ENCOUNTER — Encounter
Admission: RE | Admit: 2020-05-19 | Discharge: 2020-05-19 | Disposition: A | Discharge status: Home / Self Care | Payer: Medicare Other | Source: Ambulatory Visit | Attending: Physician Assistant | Admitting: Physician Assistant

## 2020-05-19 ENCOUNTER — Other Ambulatory Visit: Payer: Self-pay

## 2020-05-19 ENCOUNTER — Ambulatory Visit
Admission: RE | Admit: 2020-05-19 | Discharge: 2020-05-19 | Disposition: A | Discharge status: Home / Self Care | Payer: Medicare Other | Source: Ambulatory Visit | Attending: Physician Assistant | Admitting: Physician Assistant

## 2020-05-19 DIAGNOSIS — M25562 Pain in left knee: Secondary | ICD-10-CM | POA: Diagnosis present

## 2020-05-19 MED ORDER — TECHNETIUM TC 99M MEDRONATE IV KIT
20.0000 | PACK | Freq: Once | INTRAVENOUS | Status: AC | PRN
  Administered 2020-05-19: 20.21 via INTRAVENOUS

## 2020-06-09 IMAGING — CT CT OF THE RIGHT HIP WITHOUT CONTRAST
2 of 4 series · 17 of 46 positions shown, 19 images · non-contrast
Comparison: None.

CLINICAL DATA: Status post fall, right hip pain, unable to bear
weight

EXAM:
CT OF THE RIGHT HIP WITHOUT CONTRAST
TECHNIQUE: Multidetector CT imaging of the right hip was performed according to
the standard protocol. Multiplanar CT image reconstructions were
also generated.

[Series 3: axial st · axial · 0.49mm/px · z∈[-67,+171]mm · 14 of 139 slices shown, 16 images]
[im 10/139  soft-tissue]
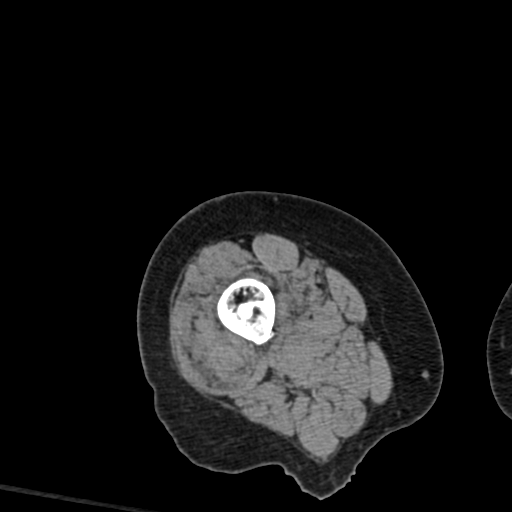
[im 10/139  bone]
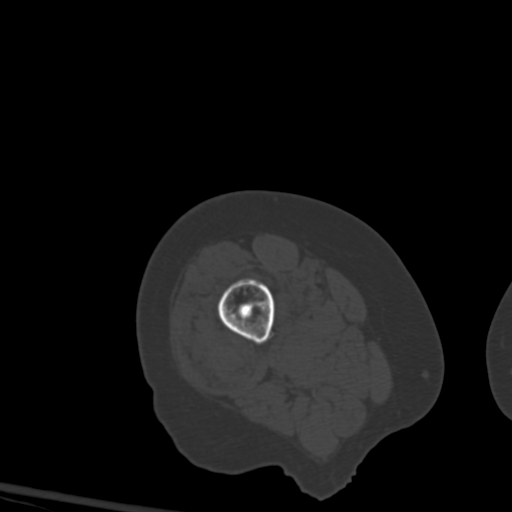
[im 19/139  soft-tissue]
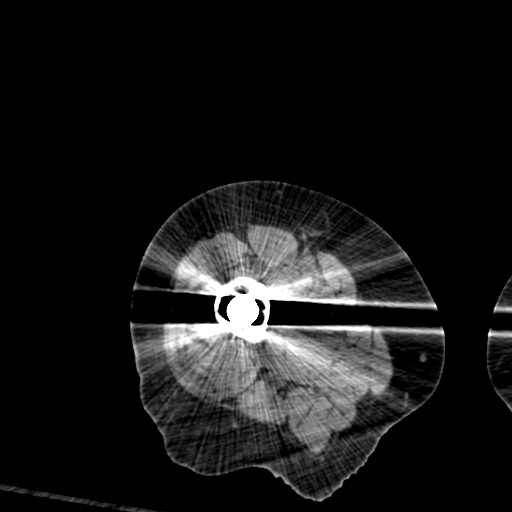
[im 28/139  soft-tissue]
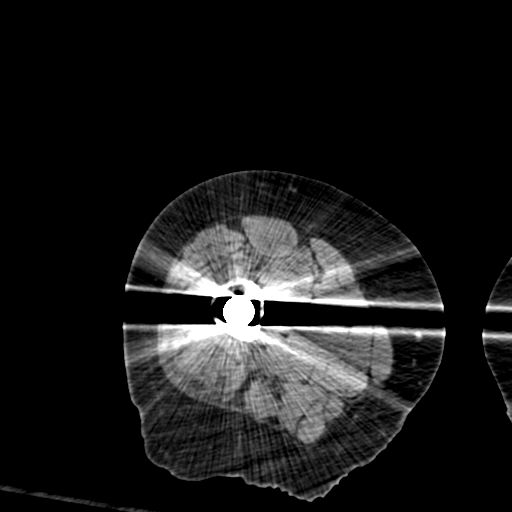
[im 37/139  soft-tissue]
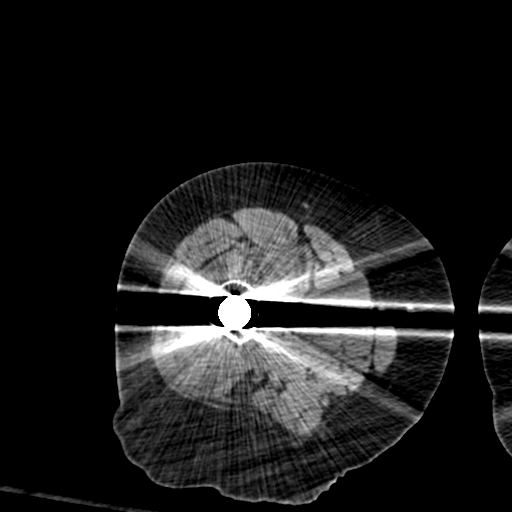
[im 47/139  soft-tissue]
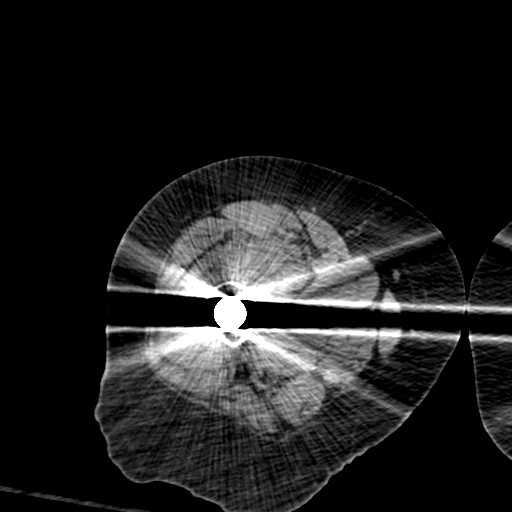
[im 56/139  soft-tissue]
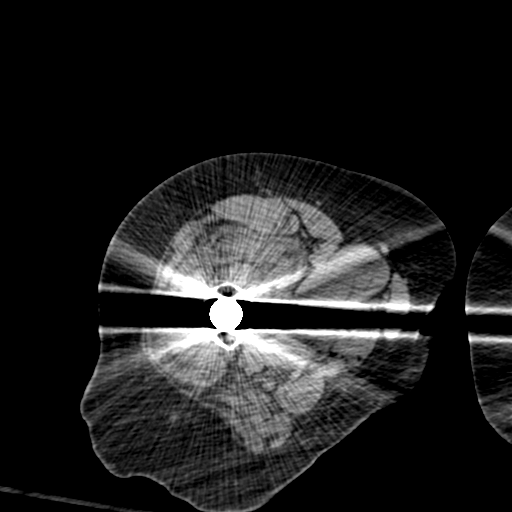
[im 65/139  soft-tissue]
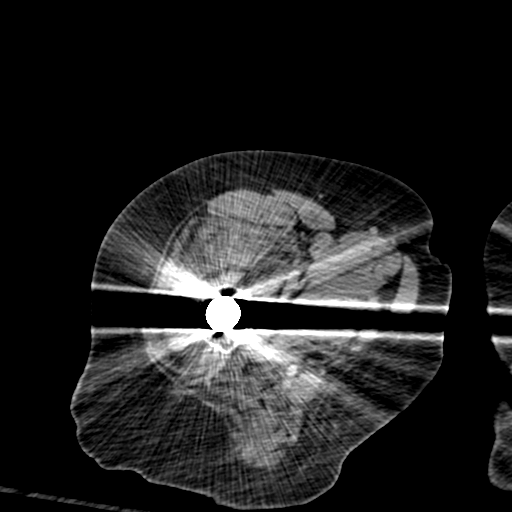
[im 74/139  soft-tissue]
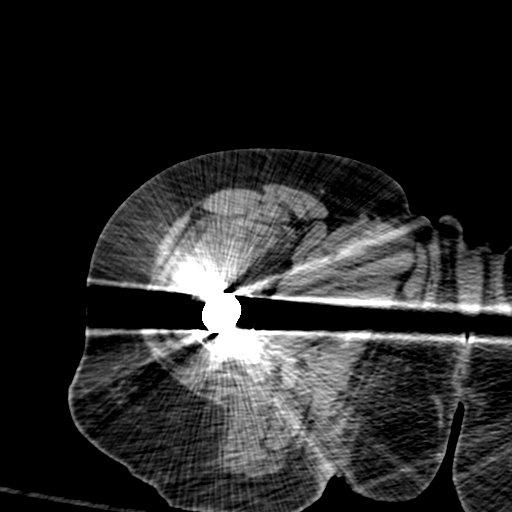
[im 83/139  soft-tissue]
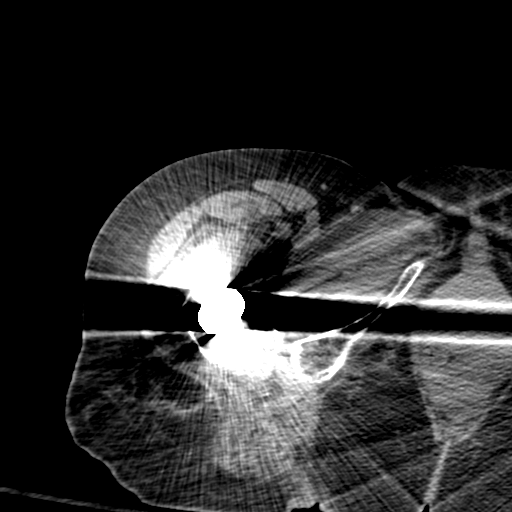
[im 83/139  bone]
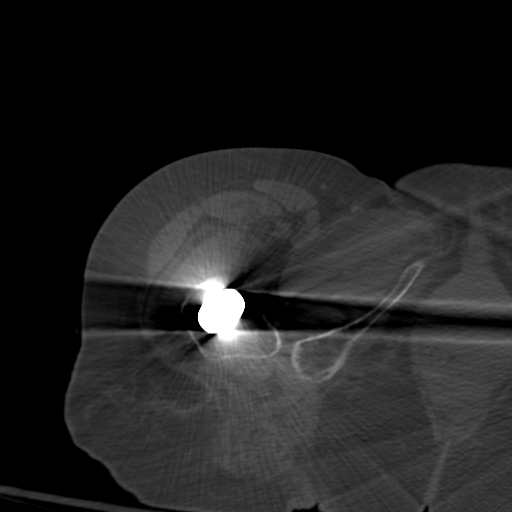
[im 93/139  soft-tissue]
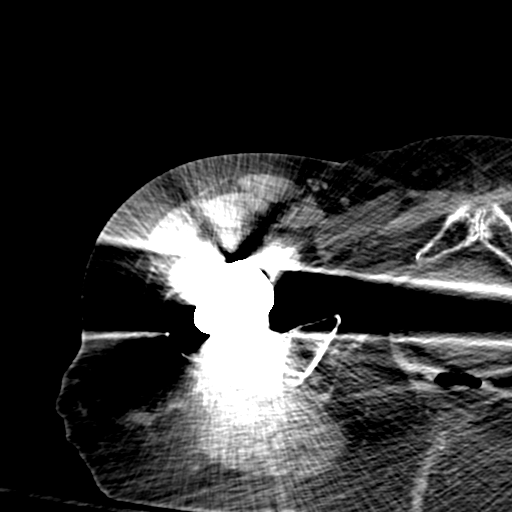
[im 102/139  soft-tissue]
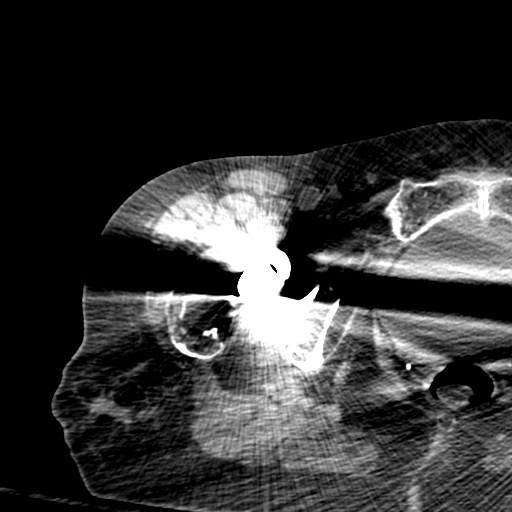
[im 111/139  soft-tissue]
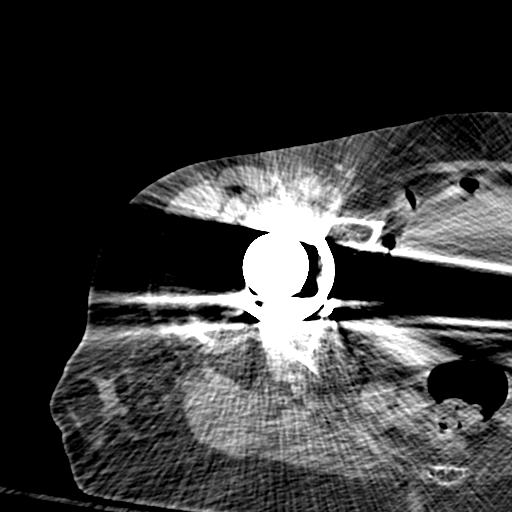
[im 120/139  soft-tissue]
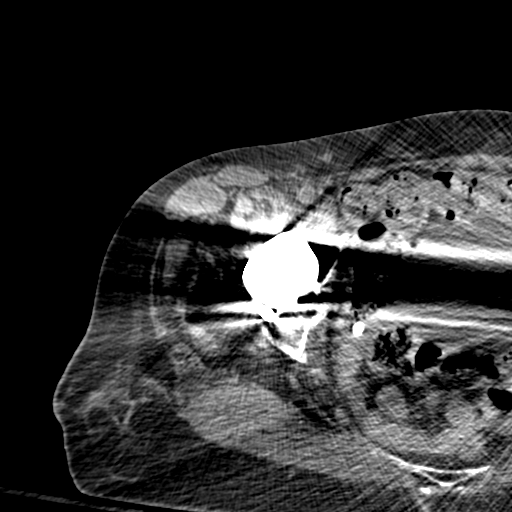
[im 129/139  soft-tissue]
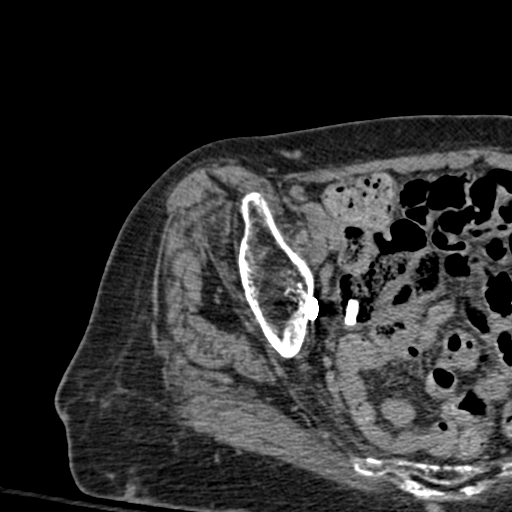

[Series 6: coronal st · coronal · 0.55mm/px · 3 of 96 slices shown]
[im 32/96  soft-tissue]
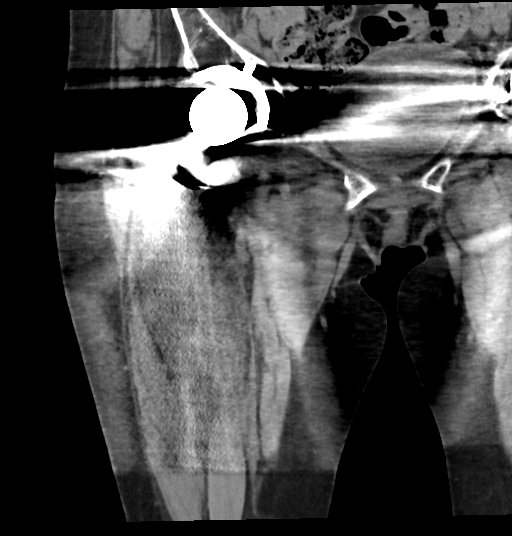
[im 43/96  soft-tissue]
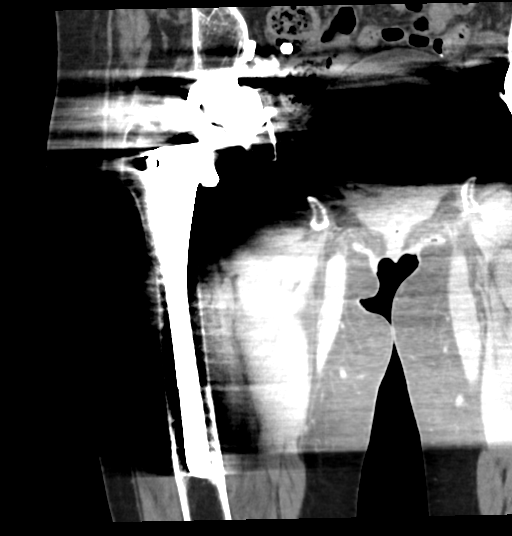
[im 53/96  soft-tissue]
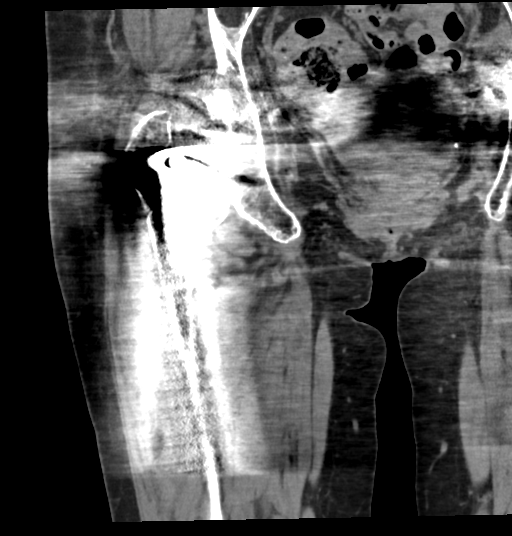

[17 of 46 positions shown; findings below may reference images not displayed]

FINDINGS: Bones/Joint/Cartilage

Generalized osteopenia. Right total hip arthroplasty. Beam hardening
artifact partially obscures adjacent soft tissue and osseous
structures.

No acute fracture or dislocation. No aggressive osseous lesion. No
hardware failure or complication.

Ligaments

Suboptimally assessed by CT.

Muscles and Tendons

Muscles are normal.  No muscle atrophy.

Soft tissues

No fluid collection or hematoma. No soft tissue mass. Postsurgical
changes in the soft tissues overlying the right greater trochanter.
IMPRESSION: 1. Right total hip arthroplasty. Beam hardening artifact partially
obscures adjacent soft tissue and osseous structures. No evidence of
acute osseous injury of the right hip.

## 2020-06-26 ENCOUNTER — Other Ambulatory Visit: Payer: Self-pay | Admitting: Family Medicine

## 2020-06-26 DIAGNOSIS — Z1231 Encounter for screening mammogram for malignant neoplasm of breast: Secondary | ICD-10-CM

## 2020-07-21 ENCOUNTER — Other Ambulatory Visit: Payer: Self-pay

## 2020-07-21 ENCOUNTER — Ambulatory Visit
Admission: RE | Admit: 2020-07-21 | Discharge: 2020-07-21 | Disposition: A | Discharge status: Home / Self Care | Payer: Medicare Other | Source: Ambulatory Visit | Attending: Family Medicine | Admitting: Family Medicine

## 2020-07-21 DIAGNOSIS — Z1231 Encounter for screening mammogram for malignant neoplasm of breast: Secondary | ICD-10-CM | POA: Insufficient documentation

## 2020-08-17 ENCOUNTER — Ambulatory Visit (INDEPENDENT_AMBULATORY_CARE_PROVIDER_SITE_OTHER): Payer: Medicare Other | Admitting: Obstetrics and Gynecology

## 2020-08-17 ENCOUNTER — Encounter: Payer: Self-pay | Admitting: Obstetrics and Gynecology

## 2020-08-17 ENCOUNTER — Other Ambulatory Visit: Payer: Self-pay

## 2020-08-17 VITALS — BP 147/89 | HR 77 | Ht 63.0 in | Wt 107.9 lb

## 2020-08-17 DIAGNOSIS — N8111 Cystocele, midline: Secondary | ICD-10-CM | POA: Diagnosis not present

## 2020-08-17 DIAGNOSIS — N816 Rectocele: Secondary | ICD-10-CM | POA: Diagnosis not present

## 2020-08-17 DIAGNOSIS — N8189 Other female genital prolapse: Secondary | ICD-10-CM

## 2020-08-17 NOTE — Progress Notes (Signed)
HPI:      [name] is a 69 y.o. [id number] who LMP was No LMP recorded. Patient has had a hysterectomy.  Subjective:   She presents today for annual follow-up of her cystocele and rectocele.  She reports that she is not having any problems.  She states that she occasionally feels her bladder and pressure from it.  She attempts to keep her bladder empty most of the time.  She reports no problems with bowel movements.  She reports no leakage of urine.    Hx: The following portions of the patient's history were reviewed and updated as appropriate:             She  has a past medical history of Asthma, Collagen vascular disease (HCC), Cystocele, Enterocele, Gallstones (11/2018), Muscle inflammation, Rectocele, Rheumatoid arthritis (HCC), and Vaginal atrophy. She does not have any pertinent problems on file. She  has a past surgical history that includes Total hip arthroplasty (Bilateral, [id number]); hand surgery (2002); Foot surgery (Bilateral, 1993); knee replacement (Bilateral, 1993); neck fusion (2000); Thyroid surgery (1999); Shoulder arthroscopy (2001); Anterior and posterior vaginal repair (2009); Abdominal hysterectomy (1980); Carpal tunnel release (Left); Wrist surgery (Left, 1986); Temporomandibular joint arthroplasty (1991); Elbow surgery; and Hip Closed Reduction (Right, 05/02/2019). Her family history includes Breast cancer in her cousin and maternal aunt; Breast cancer (age of onset: 56) in her paternal aunt; Breast cancer (age of onset: 5) in her maternal aunt; Colon cancer in her cousin; Diabetes in her brother, mother, and paternal aunt. She  reports that she has never smoked. She has never used smokeless tobacco. She reports that she does not drink alcohol and does not use drugs. She has a current medication list which includes the following prescription(s): aspirin ec, calcium-vitamin d, cyanocobalamin, etanercept, etodolac, ferrous sulfate, hydrochlorothiazide,  methotrexate, multi-vitamins, multiple vitamins-minerals, omeprazole, prednisone, promethazine, synthroid, tramadol, vitamin e, albuterol, and fish oil. She is allergic to b12-c-fa-fe [iron-vit c-vit b12-folic acid], sulfa antibiotics, vitamin b12, alendronate sodium, demerol [meperidine], diclofenac, humira [adalimumab], hydrocodone-acetaminophen, ibandronic acid, morphine and related, nalfon [fenoprofen calcium], remicade [infliximab], sulfasalazine, imuran [azathioprine], oxaprozin, and oxcarbazepine.       Review of Systems:  Review of Systems  Constitutional: Denied constitutional symptoms, night sweats, recent illness, fatigue, fever, insomnia and weight loss.  Eyes: Denied eye symptoms, eye pain, photophobia, vision change and visual disturbance.  Ears/Nose/Throat/Neck: Denied ear, nose, throat or neck symptoms, hearing loss, nasal discharge, sinus congestion and sore throat.  Cardiovascular: Denied cardiovascular symptoms, arrhythmia, chest pain/pressure, edema, exercise intolerance, orthopnea and palpitations.  Respiratory: Denied pulmonary symptoms, asthma, pleuritic pain, productive sputum, cough, dyspnea and wheezing.  Gastrointestinal: Denied, gastro-esophageal reflux, melena, nausea and vomiting.  Genitourinary: See HPI for additional information.  Musculoskeletal: Denied musculoskeletal symptoms, stiffness, swelling, muscle weakness and myalgia.  Dermatologic: Denied dermatology symptoms, rash and scar.  Neurologic: Denied neurology symptoms, dizziness, headache, neck pain and syncope.  Psychiatric: Denied psychiatric symptoms, anxiety and depression.  Endocrine: Denied endocrine symptoms including hot flashes and night sweats.   Meds:   Current Outpatient Medications on File Prior to Visit  Medication Sig Dispense Refill  . aspirin EC 81 MG tablet Take 81 mg by mouth daily.     . Calcium-Vitamin D (CALTRATE 600 PLUS-VIT D PO) Take 1 tablet by mouth daily.     . Cyanocobalamin  (RA VITAMIN B-12 TR) 1000 MCG TBCR Take 1 tablet by mouth daily.     Marland Kitchen etanercept (ENBREL) 25 MG injection Inject 25 mg into the  skin once a week.     . etodolac (LODINE) 500 MG tablet Take 500 mg by mouth 2 (two) times daily.     . ferrous sulfate 325 (65 FE) MG tablet Take 325 mg by mouth daily with breakfast.    . hydrochlorothiazide (HYDRODIURIL) 25 MG tablet Take 25 mg by mouth daily.    . methotrexate (RHEUMATREX) 2.5 MG tablet Take 10 mg by mouth once a week.     . Multiple Vitamin (MULTI-VITAMINS) TABS Take 1 tablet by mouth daily.     . Multiple Vitamins-Minerals (PRESERVISION AREDS 2 PO) Take 1 tablet by mouth daily.     Marland Kitchen omeprazole (PRILOSEC) 20 MG capsule Take 20 mg by mouth daily.     . predniSONE (DELTASONE) 5 MG tablet Take 2.5 mg by mouth daily.     . promethazine (PHENERGAN) 25 MG tablet Take 12.5-25 mg by mouth every 6 (six) hours as needed for nausea or vomiting.     Marland Kitchen SYNTHROID 100 MCG tablet Take 100 mcg by mouth daily.    . traMADol (ULTRAM) 50 MG tablet Take 50 mg by mouth 2 (two) times daily as needed (pain).     . vitamin E 400 UNIT capsule Take 400 Units by mouth daily.     Marland Kitchen albuterol (PROAIR HFA) 108 (90 BASE) MCG/ACT inhaler Inhale into the lungs. (Patient not taking: Reported on 08/17/2020)    . Omega-3 Fatty Acids (FISH OIL) 1000 MG CAPS Take 1 capsule by mouth daily.  (Patient not taking: Reported on 08/17/2020)     No current facility-administered medications on file prior to visit.    Objective:     Vitals:   08/17/20 0943  BP: (!) 147/89  Pulse: 77              Physical examination   Pelvic:   Vulva: Normal appearance.  No lesions.  Vagina: No lesions or abnormalities noted.  Mild vaginal atrophy  Support:  Third-degree cystocele second-degree rectocele  Urethra No masses tenderness or scarring.  Meatus Normal size without lesions or prolapse.  Cervix:  Surgically absent  Anus: Normal exam.  No lesions.  Perineum: Normal exam.  No lesions.         Bimanual   Uterus:  Surgically absent  Adnexae: No masses.  Non-tender to palpation.  Cul-de-sac: Negative for abnormality.     Assessment:    [id number] Patient Active Problem List   Diagnosis Date Noted  . Hip dislocation, right, initial encounter (HCC) 05/02/2019  . Family history of breast cancer 07/23/2018  . 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 B12 deficiency 07/12/2015  . Borderline blood pressure 07/12/2015  . Midline cystocele 07/12/2015  . Osteopenia 07/12/2015  . Arthritis or polyarthritis, rheumatoid (HCC) 07/12/2015  . Near syncope 07/12/2015  . Thyroid nodule 07/12/2015  . History of melena 09/20/2014  . Pain in rectum 09/20/2014  . Adult hypothyroidism 08/12/2014  . History of implantation of joint prosthesis of elbow 05/11/2014  . Ankle pain 03/16/2012  . Rheumatoid arthritis of foot (HCC) 03/16/2012  . History of artificial joint 03/13/2012     1. Midline cystocele   2. Pelvic relaxation disorder   3. Rectocele     Patient not experience any problems from her pelvic relaxation at this time.   Plan:            1.  Continue current management.  Follow-up in 1 year for reevaluation or if symptoms begin. Orders No orders  of the defined types were placed in this encounter.   No orders of the defined types were placed in this encounter.     F/U  No follow-ups on file. I spent 22 minutes involved in the care of this patient preparing to see the patient by obtaining and reviewing her medical history (including labs, imaging tests and prior procedures), documenting clinical information in the electronic health record (EHR), counseling and coordinating care plans, writing and sending prescriptions, ordering tests or procedures and directly communicating with the patient by discussing pertinent items from her history and physical exam as well as detailing my assessment and plan as noted above so that she has an  informed understanding.  All of her questions were answered.  Elonda Husky, M.D. 08/17/2020 10:11 AM

## 2021-01-23 DIAGNOSIS — I2699 Other pulmonary embolism without acute cor pulmonale: Secondary | ICD-10-CM

## 2021-01-23 DIAGNOSIS — I82409 Acute embolism and thrombosis of unspecified deep veins of unspecified lower extremity: Secondary | ICD-10-CM

## 2021-01-23 HISTORY — DX: Acute embolism and thrombosis of unspecified deep veins of unspecified lower extremity: I82.409

## 2021-01-23 HISTORY — DX: Other pulmonary embolism without acute cor pulmonale: I26.99

## 2021-02-22 ENCOUNTER — Observation Stay
Admission: EM | Admit: 2021-02-22 | Discharge: 2021-02-23 | Disposition: A | Discharge status: Home / Self Care | Payer: Medicare Other | Attending: Internal Medicine | Admitting: Internal Medicine

## 2021-02-22 ENCOUNTER — Ambulatory Visit
Admission: RE | Admit: 2021-02-22 | Discharge: 2021-02-22 | Disposition: A | Discharge status: Home / Self Care | Payer: Medicare Other | Source: Ambulatory Visit | Attending: Physician Assistant | Admitting: Physician Assistant

## 2021-02-22 ENCOUNTER — Other Ambulatory Visit: Payer: Self-pay

## 2021-02-22 ENCOUNTER — Other Ambulatory Visit: Payer: Self-pay | Admitting: Physician Assistant

## 2021-02-22 ENCOUNTER — Emergency Department: Payer: Medicare Other

## 2021-02-22 DIAGNOSIS — E039 Hypothyroidism, unspecified: Secondary | ICD-10-CM | POA: Diagnosis present

## 2021-02-22 DIAGNOSIS — I2699 Other pulmonary embolism without acute cor pulmonale: Secondary | ICD-10-CM | POA: Diagnosis not present

## 2021-02-22 DIAGNOSIS — Z96653 Presence of artificial knee joint, bilateral: Secondary | ICD-10-CM | POA: Diagnosis not present

## 2021-02-22 DIAGNOSIS — Z20822 Contact with and (suspected) exposure to covid-19: Secondary | ICD-10-CM | POA: Diagnosis not present

## 2021-02-22 DIAGNOSIS — I82412 Acute embolism and thrombosis of left femoral vein: Secondary | ICD-10-CM | POA: Diagnosis not present

## 2021-02-22 DIAGNOSIS — I10 Essential (primary) hypertension: Secondary | ICD-10-CM | POA: Insufficient documentation

## 2021-02-22 DIAGNOSIS — I82402 Acute embolism and thrombosis of unspecified deep veins of left lower extremity: Secondary | ICD-10-CM | POA: Diagnosis not present

## 2021-02-22 DIAGNOSIS — Z96643 Presence of artificial hip joint, bilateral: Secondary | ICD-10-CM | POA: Insufficient documentation

## 2021-02-22 DIAGNOSIS — M069 Rheumatoid arthritis, unspecified: Secondary | ICD-10-CM | POA: Diagnosis present

## 2021-02-22 DIAGNOSIS — R600 Localized edema: Secondary | ICD-10-CM

## 2021-02-22 DIAGNOSIS — Z96698 Presence of other orthopedic joint implants: Secondary | ICD-10-CM | POA: Diagnosis not present

## 2021-02-22 DIAGNOSIS — M79605 Pain in left leg: Secondary | ICD-10-CM | POA: Diagnosis present

## 2021-02-22 DIAGNOSIS — I2694 Multiple subsegmental pulmonary emboli without acute cor pulmonale: Secondary | ICD-10-CM | POA: Diagnosis not present

## 2021-02-22 DIAGNOSIS — J4520 Mild intermittent asthma, uncomplicated: Secondary | ICD-10-CM | POA: Diagnosis not present

## 2021-02-22 DIAGNOSIS — Z7982 Long term (current) use of aspirin: Secondary | ICD-10-CM | POA: Insufficient documentation

## 2021-02-22 DIAGNOSIS — K219 Gastro-esophageal reflux disease without esophagitis: Secondary | ICD-10-CM | POA: Diagnosis present

## 2021-02-22 DIAGNOSIS — O2230 Deep phlebothrombosis in pregnancy, unspecified trimester: Secondary | ICD-10-CM

## 2021-02-22 DIAGNOSIS — Z79899 Other long term (current) drug therapy: Secondary | ICD-10-CM | POA: Insufficient documentation

## 2021-02-22 LAB — CBC WITH DIFFERENTIAL/PLATELET
Abs Immature Granulocytes: 0.02 10*3/uL (ref 0.00–0.07)
Basophils Absolute: 0 10*3/uL (ref 0.0–0.1)
Basophils Relative: 0 %
Eosinophils Absolute: 0.2 10*3/uL (ref 0.0–0.5)
Eosinophils Relative: 3 %
HCT: 34.5 % — ABNORMAL LOW (ref 36.0–46.0)
Hemoglobin: 11.6 g/dL — ABNORMAL LOW (ref 12.0–15.0)
Immature Granulocytes: 0 %
Lymphocytes Relative: 23 %
Lymphs Abs: 1.3 10*3/uL (ref 0.7–4.0)
MCH: 31.2 pg (ref 26.0–34.0)
MCHC: 33.6 g/dL (ref 30.0–36.0)
MCV: 92.7 fL (ref 80.0–100.0)
Monocytes Absolute: 0.5 10*3/uL (ref 0.1–1.0)
Monocytes Relative: 8 %
Neutro Abs: 3.6 10*3/uL (ref 1.7–7.7)
Neutrophils Relative %: 66 %
Platelets: 277 10*3/uL (ref 150–400)
RBC: 3.72 MIL/uL — ABNORMAL LOW (ref 3.87–5.11)
RDW: 15.9 % — ABNORMAL HIGH (ref 11.5–15.5)
WBC: 5.6 10*3/uL (ref 4.0–10.5)
nRBC: 0 % (ref 0.0–0.2)

## 2021-02-22 LAB — BASIC METABOLIC PANEL
Anion gap: 9 (ref 5–15)
BUN: 17 mg/dL (ref 8–23)
CO2: 27 mmol/L (ref 22–32)
Calcium: 8.8 mg/dL — ABNORMAL LOW (ref 8.9–10.3)
Chloride: 103 mmol/L (ref 98–111)
Creatinine, Ser: <0.3 mg/dL — ABNORMAL LOW (ref 0.44–1.00)
Glucose, Bld: 104 mg/dL — ABNORMAL HIGH (ref 70–99)
Potassium: 3.9 mmol/L (ref 3.5–5.1)
Sodium: 139 mmol/L (ref 135–145)

## 2021-02-22 LAB — MAGNESIUM: Magnesium: 2.1 mg/dL (ref 1.7–2.4)

## 2021-02-22 LAB — RESP PANEL BY RT-PCR (FLU A&B, COVID) ARPGX2
Influenza A by PCR: NEGATIVE
Influenza B by PCR: NEGATIVE
SARS Coronavirus 2 by RT PCR: NEGATIVE

## 2021-02-22 LAB — PROTIME-INR
INR: 1 (ref 0.8–1.2)
Prothrombin Time: 12.6 seconds (ref 11.4–15.2)

## 2021-02-22 LAB — APTT: aPTT: 33 seconds (ref 24–36)

## 2021-02-22 LAB — TROPONIN I (HIGH SENSITIVITY): Troponin I (High Sensitivity): 3 ng/L (ref <18)

## 2021-02-22 MED ORDER — FERROUS SULFATE 325 (65 FE) MG PO TABS
325.0000 mg | ORAL_TABLET | Freq: Every day | ORAL | Status: DC
  Administered 2021-02-23: 325 mg via ORAL
  Filled 2021-02-22: qty 1

## 2021-02-22 MED ORDER — LEVOTHYROXINE SODIUM 100 MCG PO TABS
100.0000 ug | ORAL_TABLET | Freq: Every day | ORAL | Status: DC
  Administered 2021-02-23: 100 ug via ORAL
  Filled 2021-02-22: qty 1

## 2021-02-22 MED ORDER — ACETAMINOPHEN 325 MG PO TABS: 650.0000 mg | ORAL_TABLET | Freq: Four times a day (QID) | ORAL | Status: DC | PRN

## 2021-02-22 MED ORDER — IOHEXOL 350 MG/ML SOLN
75.0000 mL | Freq: Once | INTRAVENOUS | Status: AC | PRN
  Administered 2021-02-22: 60 mL via INTRAVENOUS

## 2021-02-22 MED ORDER — PREDNISONE 2.5 MG PO TABS
2.5000 mg | ORAL_TABLET | Freq: Every day | ORAL | Status: DC
Start: 2021-02-23 — End: 2021-02-23
  Administered 2021-02-23: 2.5 mg via ORAL
  Filled 2021-02-22: qty 1

## 2021-02-22 MED ORDER — ADULT MULTIVITAMIN W/MINERALS CH
1.0000 | ORAL_TABLET | Freq: Every day | ORAL | Status: DC
  Administered 2021-02-23: 1 via ORAL
  Filled 2021-02-22: qty 1

## 2021-02-22 MED ORDER — HEPARIN BOLUS VIA INFUSION
3200.0000 [IU] | Freq: Once | INTRAVENOUS | Status: AC
  Administered 2021-02-22: 3200 [IU] via INTRAVENOUS
  Filled 2021-02-22: qty 3200

## 2021-02-22 MED ORDER — HYDROCODONE-ACETAMINOPHEN 5-325 MG PO TABS
1.0000 | ORAL_TABLET | Freq: Four times a day (QID) | ORAL | Status: DC | PRN
  Administered 2021-02-22 – 2021-02-23 (×3): 1 via ORAL
  Filled 2021-02-22 (×3): qty 1

## 2021-02-22 MED ORDER — ACETAMINOPHEN 650 MG RE SUPP: 650.0000 mg | Freq: Four times a day (QID) | RECTAL | Status: DC | PRN

## 2021-02-22 MED ORDER — PANTOPRAZOLE SODIUM 40 MG PO TBEC
40.0000 mg | DELAYED_RELEASE_TABLET | Freq: Every day | ORAL | Status: DC
  Administered 2021-02-23: 40 mg via ORAL
  Filled 2021-02-22: qty 1

## 2021-02-22 MED ORDER — HYDROCODONE-ACETAMINOPHEN 5-325 MG PO TABS
1.0000 | ORAL_TABLET | ORAL | Status: AC
  Administered 2021-02-22: 1 via ORAL
  Filled 2021-02-22: qty 1

## 2021-02-22 MED ORDER — HEPARIN (PORCINE) 25000 UT/250ML-% IV SOLN
1050.0000 [IU]/h | INTRAVENOUS | Status: DC
  Administered 2021-02-22: 850 [IU]/h via INTRAVENOUS
  Filled 2021-02-22: qty 250

## 2021-02-22 MED ORDER — ONDANSETRON HCL 4 MG/2ML IJ SOLN: 4.0000 mg | Freq: Four times a day (QID) | INTRAMUSCULAR | Status: DC | PRN

## 2021-02-22 MED ORDER — ALBUTEROL SULFATE HFA 108 (90 BASE) MCG/ACT IN AERS
1.0000 | INHALATION_SPRAY | RESPIRATORY_TRACT | Status: DC | PRN
  Filled 2021-02-22: qty 6.7

## 2021-02-22 NOTE — ED Provider Notes (Addendum)
[facility] Emergency Department Provider Note  ____________________________________________   Event Date/Time   First MD Initiated Contact with Patient 02/22/21 1617     (approximate)  I have reviewed the triage vital signs and the nursing notes.   HISTORY  Chief Complaint Leg Pain   HPI Crystal [name] is a 70 y.o. female with a past medical history of RA on Enbrel and methotrexate, collagen vascular disease, asthma, cystocele, rectocele, and asthma who presents for assessment after she had an ultrasound done today on outpatient basis in her left lower extremity secondary to about a month of some soreness in her upper left leg which was positive for DVT.  Patient also states she has had some intermittent shortness of breath over the last couple weeks.  She denies any chest pain, cough, fevers, headache or earache, sore throat, nausea, vomiting, diarrhea, dysuria abdominal pain, back pain or other acute sick symptoms.  She does note she fell about a month ago shortly before her symptoms began and was noted to have a small hip fracture that has been thus far managed nonoperatively.  No history of DVT/PE.  She is not currently on any blood thinners.          Past Medical History:  Diagnosis Date  . Asthma   . Collagen vascular disease (HCC)   . Cystocele    2nd degree  . Enterocele    early  . Gallstones 11/2018  . Muscle inflammation   . Rectocele    mild  . Rheumatoid arthritis (HCC)   . Vaginal atrophy     Patient Active Problem List   Diagnosis Date Noted  . Hip dislocation, right, initial encounter (HCC) 05/02/2019  . Family history of breast cancer 07/23/2018  . 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 B12 deficiency 07/12/2015  . Borderline blood pressure 07/12/2015  . Midline cystocele 07/12/2015  . Osteopenia 07/12/2015  . Arthritis or polyarthritis, rheumatoid (HCC) 07/12/2015  . Near  syncope 07/12/2015  . Thyroid nodule 07/12/2015  . History of melena 09/20/2014  . Pain in rectum 09/20/2014  . Adult hypothyroidism 08/12/2014  . History of implantation of joint prosthesis of elbow 05/11/2014  . Ankle pain 03/16/2012  . Rheumatoid arthritis of foot (HCC) 03/16/2012  . History of artificial joint 03/13/2012    Past Surgical History:  Procedure Laterality Date  . ABDOMINAL HYSTERECTOMY  1980  . ANTERIOR AND POSTERIOR VAGINAL REPAIR  2009   enterocele ligation  . CARPAL TUNNEL RELEASE Left    1982  . ELBOW SURGERY    . FOOT SURGERY Bilateral 1993  . hand surgery  2002   7 joint implants  . HIP CLOSED REDUCTION Right 05/02/2019   Procedure: CLOSED REDUCTION HIP;  Surgeon: Ranee Gosselin, MD;  Location: [facility] OR;  Service: Orthopedics;  Laterality: Right;  . knee replacement Bilateral 1993  . neck fusion  2000  . SHOULDER ARTHROSCOPY  2001  . TEMPOROMANDIBULAR JOINT ARTHROPLASTY  1991  . THYROID SURGERY  1999  . TOTAL HIP ARTHROPLASTY Bilateral [id number]  . WRIST SURGERY Left 1986    Prior to Admission medications   Medication Sig Start Date End Date Taking? Authorizing Provider  albuterol (PROAIR HFA) 108 (90 BASE) MCG/ACT inhaler Inhale into the lungs. Patient not taking: Reported on 08/17/2020 08/12/14   [provider]  aspirin EC 81 MG tablet Take 81 mg by mouth daily.     [provider]  Calcium-Vitamin D (CALTRATE 600  PLUS-VIT D PO) Take 1 tablet by mouth daily.     [provider]  Cyanocobalamin (RA VITAMIN B-12 TR) 1000 MCG TBCR Take 1 tablet by mouth daily.     [provider]  etanercept (ENBREL) 25 MG injection Inject 25 mg into the skin once a week.     [provider]  etodolac (LODINE) 500 MG tablet Take 500 mg by mouth 2 (two) times daily.  01/17/15   [provider]  ferrous sulfate 325 (65 FE) MG tablet Take 325 mg by mouth daily with breakfast.    [provider]  hydrochlorothiazide  (HYDRODIURIL) 25 MG tablet Take 25 mg by mouth daily. 07/13/18   [provider]  methotrexate (RHEUMATREX) 2.5 MG tablet Take 10 mg by mouth once a week.  06/03/16   [provider]  Multiple Vitamin (MULTI-VITAMINS) TABS Take 1 tablet by mouth daily.     [provider]  Multiple Vitamins-Minerals (PRESERVISION AREDS 2 PO) Take 1 tablet by mouth daily.     [provider]  Omega-3 Fatty Acids (FISH OIL) 1000 MG CAPS Take 1 capsule by mouth daily.  Patient not taking: Reported on 08/17/2020    [provider]  omeprazole (PRILOSEC) 20 MG capsule Take 20 mg by mouth daily.  11/07/14   [provider]  predniSONE (DELTASONE) 5 MG tablet Take 2.5 mg by mouth daily.  02/21/15   [provider]  promethazine (PHENERGAN) 25 MG tablet Take 12.5-25 mg by mouth every 6 (six) hours as needed for nausea or vomiting.  06/15/15   [provider]  SYNTHROID 100 MCG tablet Take 100 mcg by mouth daily. 03/17/19   [provider]  traMADol (ULTRAM) 50 MG tablet Take 50 mg by mouth 2 (two) times daily as needed (pain).  07/17/17   [provider]  vitamin E 400 UNIT capsule Take 400 Units by mouth daily.     [provider]    Allergies B12-c-fa-fe [iron-vit c-vit b12-folic acid], Sulfa antibiotics, Vitamin b12, Alendronate sodium, Demerol [meperidine], Diclofenac, Humira [adalimumab], Hydrocodone-acetaminophen, Ibandronic acid, Morphine and related, Nalfon [fenoprofen calcium], Remicade [infliximab], Sulfasalazine, Imuran [azathioprine], Oxaprozin, and Oxcarbazepine  Family History  Problem Relation Age of Onset  . Breast cancer Maternal Aunt        32  . Breast cancer Paternal Aunt 64  . Diabetes Paternal Aunt   . Breast cancer Cousin        3 maternal cousins, <50  . Colon cancer Cousin   . Diabetes Mother   . Diabetes Brother   . Breast cancer Maternal Aunt 61  . Ovarian cancer Neg Hx     Social  History Social History   Tobacco Use  . Smoking status: Never Smoker  . Smokeless tobacco: Never Used  Vaping Use  . Vaping Use: Never used  Substance Use Topics  . Alcohol use: No    Alcohol/week: 0.0 standard drinks  . Drug use: No    Review of Systems  Review of Systems  Constitutional: Negative for chills and fever.  HENT: Negative for sore throat.   Eyes: Negative for pain.  Respiratory: Positive for shortness of breath. Negative for cough and stridor.   Cardiovascular: Negative for chest pain.  Gastrointestinal: Negative for vomiting.  Musculoskeletal: Positive for myalgias ( L leg).  Skin: Negative for rash.  Neurological: Negative for seizures, loss of consciousness and headaches.  Psychiatric/Behavioral: Negative for suicidal ideas.  All other systems reviewed and are negative.  ____________________________________________   PHYSICAL EXAM:  VITAL SIGNS: ED Triage Vitals  Enc Vitals Group     BP 02/22/21 1609 (!) 147/82     Pulse Rate 02/22/21 1609 70     Resp 02/22/21 1609 18     Temp 02/22/21 1609 98.4 F (36.9 C)     Temp Source 02/22/21 1609 Oral     SpO2 02/22/21 1609 97 %     Weight 02/22/21 1611 116 lb (52.6 kg)     Height 02/22/21 1611 5' (1.524 m)     Head Circumference --      Peak Flow --      Pain Score 02/22/21 1610 7     Pain Loc --      Pain Edu? --      Excl. in GC? --    Vitals:   02/22/21 1729 02/22/21 1800  BP:  (!) 142/64  Pulse: 72 (!) 112  Resp: 14 (!) 33  Temp:    SpO2: 99% 96%   Physical Exam Vitals and nursing note reviewed.  Constitutional:      General: She is not in acute distress.    Appearance: She is well-developed.  HENT:     Head: Normocephalic and atraumatic.     Right Ear: External ear normal.     Left Ear: External ear normal.     Nose: Nose normal.  Eyes:     Conjunctiva/sclera: Conjunctivae normal.  Cardiovascular:     Rate and Rhythm: Normal rate and regular rhythm.     Heart sounds: No  murmur heard.   Pulmonary:     Effort: Pulmonary effort is normal. No respiratory distress.     Breath sounds: Normal breath sounds.  Abdominal:     Palpations: Abdomen is soft.     Tenderness: There is no abdominal tenderness.  Musculoskeletal:     Cervical back: Neck supple.  Skin:    General: Skin is warm and dry.     Capillary Refill: Capillary refill takes less than 2 seconds.  Neurological:     Mental Status: She is alert and oriented to person, place, and time.  Psychiatric:        Mood and Affect: Mood normal.     2+ bilateral DP pulses.  Patient is able to move her toes in both feet on command.  Sensation is intact to light touch throughout all extremities.  Abdomen is soft nontender throughout.  Lungs are clear bilaterally.  ____________________________________________   LABS (all labs ordered are listed, but only abnormal results are displayed)  Labs Reviewed  CBC WITH DIFFERENTIAL/PLATELET - Abnormal; Notable for the following components:      Result Value   RBC 3.72 (*)    Hemoglobin 11.6 (*)    HCT 34.5 (*)    RDW 15.9 (*)    All other components within normal limits  BASIC METABOLIC PANEL - Abnormal; Notable for the following components:   Glucose, Bld 104 (*)    Creatinine, Ser <0.30 (*)    Calcium 8.8 (*)    All other components within normal limits  RESP PANEL BY RT-PCR (FLU A&B, COVID) ARPGX2  PROTIME-INR  TROPONIN I (HIGH SENSITIVITY)  TROPONIN I (HIGH SENSITIVITY)   ____________________________________________  EKG  Sinus rhythm with a ventricular rate of 76, normal axis, unremarkable intervals and no clear evidence of acute ischemia or other significant arrhythmia. ____________________________________________  RADIOLOGY  ED MD interpretation: CTA chest evidence of likely right upper lobe and subsegmental pulmonary emboli  without evidence of pneumonia, thorax, effusion, edema or any other clear acute intrathoracic process.  Official  radiology report(s): CT Angio Chest PE W and/or Wo Contrast  Result Date: 02/22/2021 CLINICAL DATA:  Positive for DVT, short of breath with exertion EXAM: CT ANGIOGRAPHY CHEST WITH CONTRAST TECHNIQUE: Multidetector CT imaging of the chest was performed using the standard protocol during bolus administration of intravenous contrast. Multiplanar CT image reconstructions and MIPs were obtained to evaluate the vascular anatomy. CONTRAST:  21mL OMNIPAQUE IOHEXOL 350 MG/ML SOLN COMPARISON:  CT chest 10/17/2017 FINDINGS: Cardiovascular: Satisfactory opacification of the pulmonary arteries to the segmental level. Extensive streak artifact from dense contrast within the SVC highly limits assessment of the central pulmonary arteries particularly right main and proximal lobar arteries. Allowing for this, suspected small filling defects within right upper lobe segmental and subsegmental pulmonary vessels, series 6, image number 88. No other definitive filling defects are visualized. Mild aortic atherosclerosis. No aneurysmal dilatation. Normal cardiac size. No pericardial effusion. Mediastinum/Nodes: Midline trachea. Heterogenous thyroid with nodules, previously evaluated. No further workup recommended. No suspicious adenopathy. Esophagus within normal limits. Lungs/Pleura: No acute consolidation or effusion. Minimal scarring at the apices. No pneumothorax. Scattered punctate nodules without significant change, therefore felt benign. Upper Abdomen: No acute abnormality. Musculoskeletal: No chest wall abnormality. No acute or significant osseous findings. Review of the MIP images confirms the above findings. IMPRESSION: 1. Extensive streak artifact from dense contrast within the SVC highly limits assessment of the central pulmonary arteries particularly the right main and proximal lobar arteries. Allowing for this, suspected small filling defects/pulmonary emboli within right upper lobe segmental and subsegmental pulmonary  arteries. 2. Clear lung fields. 3. Aortic atherosclerosis. 4. Heterogenous thyroid with nodules, previously evaluated. Aortic Atherosclerosis (ICD10-I70.0). Critical Value/emergent results were called by telephone at the time of interpretation on 02/22/2021 at 6:59 pm to provider [facility] , who verbally acknowledged these results. Electronically Signed   By: Jasmine Pang M.D.   On: 02/22/2021 18:59   US Venous Img Lower Unilateral Left (DVT)  Result Date: 02/22/2021 CLINICAL DATA:  Left lower extremity edema EXAM: LEFT LOWER EXTREMITY VENOUS DOPPLER ULTRASOUND TECHNIQUE: Gray-scale sonography with graded compression, as well as color Doppler and duplex ultrasound were performed to evaluate the lower extremity deep venous systems from the level of the common femoral vein and including the common femoral, femoral, profunda femoral, popliteal and calf veins including the posterior tibial, peroneal and gastrocnemius veins when visible. The superficial great saphenous vein was also interrogated. Spectral Doppler was utilized to evaluate flow at rest and with distal augmentation maneuvers in the common femoral, femoral and popliteal veins. COMPARISON:  None. FINDINGS: Contralateral Common Femoral Vein: Respiratory phasicity is normal and symmetric with the symptomatic side. No evidence of thrombus. Normal compressibility. Common Femoral Vein: No evidence of thrombus. Normal compressibility, respiratory phasicity and response to augmentation. Saphenofemoral Junction: No evidence of thrombus. Normal compressibility and flow on color Doppler imaging. Profunda Femoral Vein: Trace amount of intraluminal nonocclusive hypoechoic thrombus at the profunda femoral origin. Very minor thrombus burden. No propagation into the femoral vein. Femoral Vein: No evidence of thrombus. Normal compressibility, respiratory phasicity and response to augmentation. Popliteal Vein: No evidence of thrombus. Normal compressibility,  respiratory phasicity and response to augmentation. Calf Veins: No evidence of thrombus. Normal compressibility and flow on color Doppler imaging. Other Findings: Right inguinal area complex hypoechoic mass measures 6.8 x 4.4 x 4.5 cm. The lesion is deep to the femoral vasculature and appears to overlie the hip  joint. Lesion remains indeterminate by ultrasound. No associated vascularity to suggest pseudoaneurysm. Other considerations include bulky adenopathy or an indeterminate soft tissue mass related to the hip joint. Recommend further evaluation with left hip MRI without and with contrast. IMPRESSION: Nonocclusive profunda femoral vein origin thrombus. Very minimal thrombus burden. No propagation into the femoral veins. 6.8 cm right inguinal area indeterminate hypoechoic mass. See above comment and recommendation. These results will be called to the ordering clinician or representative by the Radiology Department at the imaging location. Electronically Signed   By: Judie Petit.  Shick M.D.   On: 02/22/2021 15:46    ____________________________________________   PROCEDURES  Procedure(s) performed (including Critical Care):  .Critical Care Performed by: Gilles Chiquito, MD Authorized by: Gilles Chiquito, MD   Critical care provider statement:    Critical care time (minutes):  45   Critical care time was exclusive of:  Separately billable procedures and treating other patients   Critical care was necessary to treat or prevent imminent or life-threatening deterioration of the following conditions:  Circulatory failure   Critical care was time spent personally by me on the following activities:  Discussions with consultants, evaluation of patient's response to treatment, examination of patient, ordering and performing treatments and interventions, ordering and review of laboratory studies, ordering and review of radiographic studies, pulse oximetry, re-evaluation of patient's condition, obtaining history  from patient or surrogate and review of old charts     ____________________________________________   INITIAL IMPRESSION / ASSESSMENT AND PLAN / ED COURSE      Patient presents for further evaluation and assessment after outpatient ultrasound of left lower extremity was found positive for DVT in the setting of 1 month of some soreness in the left upper leg and some intermittent shortness of breath.  On arrival patient is afebrile hemodynamically stable.  Left lower extremity is relatively unremarkable without palpable cords and is neurovascular intact.  I reviewed her outpatient ultrasound which was positive for DVT.  However given she also noted some intermittent shortness of breath will order CTA to assess for any evidence of PE.  She denies any acute infectious symptoms.  CTA has evidence of likely subsegmental pulmonary emboli but no other acute intrathoracic process including pneumonia, thorax, effusion, edema, aneurysm, dissection or any other acute process.  ECG has no clear acute ischemia and patient denies any chest pain her troponin is 3 Evalose patient for ACS.  BMP is unremarkable.  CBC shows no significant anemia or abnormal platelets.  Covid is negative.  Patient started on heparin and given some analgesia noted below.  I will plan to admit to medicine service for acute pulmonary emboli likely secondary to left lower extremity DVT.         ____________________________________________   FINAL CLINICAL IMPRESSION(S) / ED DIAGNOSES  Final diagnoses:  DVT (deep vein thrombosis) in pregnancy  Multiple subsegmental pulmonary emboli without acute cor pulmonale (HCC)    Medications  HYDROcodone-acetaminophen (NORCO/VICODIN) 5-325 MG per tablet 1 tablet (has no administration in time range)  iohexol (OMNIPAQUE) 350 MG/ML injection 75 mL (60 mLs Intravenous Contrast Given 02/22/21 1826)     ED Discharge Orders    None       Note:  This document was prepared using Dragon  voice recognition software and may include unintentional dictation errors.   Gilles Chiquito, MD 02/22/21 Windell Moment    Gilles Chiquito, MD 02/22/21 [id number]

## 2021-02-22 NOTE — Progress Notes (Signed)
ANTICOAGULATION CONSULT NOTE  Pharmacy Consult for heparin Indication: pulmonary embolus  Allergies  Allergen Reactions  . B12-C-Fa-Fe [Iron-Vit C-Vit B12-Folic Acid] Shortness Of Breath  . Sulfa Antibiotics Other (See Comments)    Caused skin to come off in her mouth and down her throat.    . Vitamin B12 Anaphylaxis    When she takes a *HIGH DOSE*  . Alendronate Sodium     Other reaction(s): Other (See Comments) RECTAL BLEEDING  . Demerol [Meperidine] Nausea Only  . Diclofenac     Other reaction(s): Other (See Comments) MOUTH ULCERS  . Humira [Adalimumab] Swelling    brain  . Hydrocodone-Acetaminophen Nausea Only  . Ibandronic Acid Other (See Comments)    CONSTIPATION Other reaction(s): Other (See Comments) CONSTIPATION  . Morphine And Related Nausea And Vomiting  . Nalfon [Fenoprofen Calcium] Hives  . Remicade [Infliximab]     Drug induce lupus  . Sulfasalazine Other (See Comments)    Ulcer   . Imuran [Azathioprine] Rash  . Oxaprozin Rash  . Oxcarbazepine Rash    Skin peeling, like a sunburn    Patient Measurements: Height: 5' (152.4 cm) Weight: 52.6 kg (116 lb) IBW/kg (Calculated) : 45.5 Heparin Dosing Weight: 52 kg  Vital Signs: Temp: 98.4 F (36.9 C) (03/31 1609) Temp Source: Oral (03/31 1609) BP: 142/64 (03/31 1800) Pulse Rate: 112 (03/31 1800)  Labs: Recent Labs    02/22/21 1727  HGB 11.6*  HCT 34.5*  PLT 277  LABPROT 12.6  INR 1.0  CREATININE <0.30*  TROPONINIHS 3    CrCl cannot be calculated (This lab value cannot be used to calculate CrCl because it is not a number: <0.30).   Medical History: Past Medical History:  Diagnosis Date  . Asthma   . Collagen vascular disease (HCC)   . Cystocele    2nd degree  . Enterocele    early  . Gallstones 11/2018  . Muscle inflammation   . Rectocele    mild  . Rheumatoid arthritis (HCC)   . Vaginal atrophy     Assessment: 70 year old female presented with leg pain. Outpatient ultrasound  positive for DVT. Also endorses SOB. CTA with suspected small filling defects/pulmonary emboli within right upper lobe segmental and subsegmental pulmonary arteries. Patient has not started any anticoagulation prior to evaluation in ED. Pharmacy consult for heparin.  Goal of Therapy:  Heparin level 0.3-0.7 units/ml Monitor platelets by anticoagulation protocol: Yes   Plan:  Heparin 3200 unit bolus followed by infusion at 850 units/hr. Check HL 4/1 at 0200. CBC daily while on heparin drip.  Pricilla Riffle, PharmD 02/22/2021,7:19 PM

## 2021-02-22 NOTE — ED Triage Notes (Signed)
Pt with left leg pain x one month. Pt had a [place] today, thrombus found, report with chart. Pt denies swelling. Pt alert and oriented, NAD at this time.

## 2021-02-22 NOTE — H&P (Signed)
History and Physical    PLEASE NOTE THAT DRAGON DICTATION SOFTWARE WAS USED IN THE CONSTRUCTION OF THIS NOTE.   Crystal [name] HKV:[id number] DOB: September 24, 1951 DOA: 02/22/2021  PCP: Kandyce Rud, MD Patient coming from: home   I have personally briefly reviewed patient's old medical records in [facility]  Chief Complaint: Left lower extremity pain  HPI: Crystal [name] is a 70 y.o. female with medical history significant for rheumatoid arthritis on chronic immunosuppressive therapy with Enbrel/methotrexate as well as chronic prednisone therapy, intermittent asthma, acquired hypothyroidism, essential hypertension, who is admitted to [facility] on 02/22/2021 with acute pulmonary emboli and acute left lower extremity DVT after presenting from home to [facility] ED complaining of left lower extremity pain.   The patient reports that she experienced a ground-level mechanical fall during the last week of January 2022 during which she fell on her left hip.  Ensuing evaluation at that time reportedly found a small left hip fracture for which the patient has been following as an outpatient in orthopedic surgery clinic with [facility], has been managing this hip fracture nonoperatively.   For the last 1 month, the patient reports development of left calf tenderness associated with mild edema in the absence of any significant erythematous changes.  She reports that this more distal lower extremity discomfort is different from the left hip pain that she has been experiencing since the initial diagnosis of her left hip fracture during the last week of January 2022, which she reports is associated with radiation into the groin and the anterior portion of the left lower extremity proximal to the left knee.  In the setting of persistence of the left lower extremity discomfort distal to the left knee, the patient presented back to [facility] earlier today, at which time  she underwent outpatient left lower extremity venous ultrasound, which revealed a nonocclusive femoral vein thrombus.  In light of this finding, her outpatient orthopedic surgeon instructed the patient to present to the local emergency department for further evaluation and management of this new finding of acute DVT.  The patient denies any associated left lower extremity numbness or paresthesias.  She also denies any associated acute focal weakness.  Aside from the ground-level mechanical fall in the absence of any loss of consciousness that occurred in January 2022, she denies any ensuing or recent trauma.  She also denies any recent traveling, including no recent trips via airplane or prolonged car travel.  No recent surgical procedures. denies any history of underlying malignancy.  No personal history of prior DVT/PE, and denies any known family history of such.  Is on a daily baby aspirin, but otherwise on no blood thinners at home.  She also reports mild intermittent shortness of breath over the last 2 to 3 weeks, noting that her shortness of breath started a few weeks after first noting the left lower extremity tenderness/swelling.  Denies any associated chest pain, palpitations, paresthesias, nausea, vomiting, dizziness, presyncope, or syncope.  Not associated with any cough, wheezing, hemoptysis.  Denies any associated orthopnea, PND.  Also denies any recent cough, wheezing.  No recent melena or hematochezia.  Denies any associated subjective fever, chills, rigors.  Of note, the patient lives at home, where she provides care for her husband who she reports has been diagnosed with dementia.    ED Course:  Vital signs in the ED were notable for the following: Temperature max 98.4; heart rate 70-1 01; blood pressure 113/60 -147/82; respiratory rate  14-33; oxygen saturation 96 to 99% on room air.  Labs were notable for the following: BMP was notable for the following: Sodium 139, bicarbonate 27,  creatinine 0.30.  High-sensitivity troponin I x1 noted to be 3.  CBC notable for white blood cell count of 5600, hemoglobin 11.6, and platelets 277.  INR 1.0, PTT 33.  Screening nasopharyngeal COVID-19/influenza PCR performed in the ED today were found to be negative.  EKG, by way of comparison to most recent prior EKG from December 2020, showed sinus rhythm with heart rate 76, normal intervals, and no evidence of T wave or ST changes, including no evidence of ST elevation.  CTA chest performed in the ED today demonstrated small filling defect suggestive of pulmonary emboli within the right upper lobe segmental and subsegmental pulmonary arteries, but otherwise showed no evidence of acute cardiopulmonary process, including no evidence of infiltrate, pneumothorax, edema, dissection, or pleural effusion.  While in the ED, the following were administered: Heparin bolus followed by initiation of heparin drip via inpatient pharmacy consult.  Norco 5/325 mg p.o. x1.     Review of Systems: As per HPI otherwise 10 point review of systems negative.   Past Medical History:  Diagnosis Date  . Asthma   . Collagen vascular disease (HCC)   . Cystocele    2nd degree  . Enterocele    early  . Gallstones 11/2018  . Muscle inflammation   . Rectocele    mild  . Rheumatoid arthritis (HCC)   . Vaginal atrophy     Past Surgical History:  Procedure Laterality Date  . ABDOMINAL HYSTERECTOMY  1980  . ANTERIOR AND POSTERIOR VAGINAL REPAIR  2009   enterocele ligation  . CARPAL TUNNEL RELEASE Left    1982  . ELBOW SURGERY    . FOOT SURGERY Bilateral 1993  . hand surgery  2002   7 joint implants  . HIP CLOSED REDUCTION Right 05/02/2019   Procedure: CLOSED REDUCTION HIP;  Surgeon: Ranee Gosselin, MD;  Location: [facility] OR;  Service: Orthopedics;  Laterality: Right;  . knee replacement Bilateral 1993  . neck fusion  2000  . SHOULDER ARTHROSCOPY  2001  . TEMPOROMANDIBULAR JOINT ARTHROPLASTY  1991  . THYROID  SURGERY  1999  . TOTAL HIP ARTHROPLASTY Bilateral [id number]  . WRIST SURGERY Left 1986    Social History:  reports that she has never smoked. She has never used smokeless tobacco. She reports that she does not drink alcohol and does not use drugs.   Allergies  Allergen Reactions  . B12-C-Fa-Fe [Iron-Vit C-Vit B12-Folic Acid] Shortness Of Breath  . Sulfa Antibiotics Other (See Comments)    Caused skin to come off in her mouth and down her throat.    . Vitamin B12 Anaphylaxis    When she takes a *HIGH DOSE*  . Alendronate Sodium     Other reaction(s): Other (See Comments) RECTAL BLEEDING  . Demerol [Meperidine] Nausea Only  . Diclofenac     Other reaction(s): Other (See Comments) MOUTH ULCERS  . Humira [Adalimumab] Swelling    brain  . Hydrocodone-Acetaminophen Nausea Only  . Ibandronic Acid Other (See Comments)    CONSTIPATION Other reaction(s): Other (See Comments) CONSTIPATION  . Morphine And Related Nausea And Vomiting  . Nalfon [Fenoprofen Calcium] Hives  . Remicade [Infliximab]     Drug induce lupus  . Sulfasalazine Other (See Comments)    Ulcer   . Imuran [Azathioprine] Rash  . Oxaprozin Rash  . Oxcarbazepine Rash  Skin peeling, like a sunburn    Family History  Problem Relation Age of Onset  . Breast cancer Maternal Aunt        32  . Breast cancer Paternal Aunt 62  . Diabetes Paternal Aunt   . Breast cancer Cousin        3 maternal cousins, <50  . Colon cancer Cousin   . Diabetes Mother   . Diabetes Brother   . Breast cancer Maternal Aunt 61  . Ovarian cancer Neg Hx      Prior to Admission medications   Medication Sig Start Date End Date Taking? Authorizing Provider  albuterol (PROAIR HFA) 108 (90 BASE) MCG/ACT inhaler Inhale into the lungs. Patient not taking: Reported on 08/17/2020 08/12/14   [provider]  aspirin EC 81 MG tablet Take 81 mg by mouth daily.     [provider]  Calcium-Vitamin D (CALTRATE 600 PLUS-VIT D PO)  Take 1 tablet by mouth daily.     [provider]  Cyanocobalamin (RA VITAMIN B-12 TR) 1000 MCG TBCR Take 1 tablet by mouth daily.     [provider]  etanercept (ENBREL) 25 MG injection Inject 25 mg into the skin once a week.     [provider]  etodolac (LODINE) 500 MG tablet Take 500 mg by mouth 2 (two) times daily.  01/17/15   [provider]  ferrous sulfate 325 (65 FE) MG tablet Take 325 mg by mouth daily with breakfast.    [provider]  hydrochlorothiazide (HYDRODIURIL) 25 MG tablet Take 25 mg by mouth daily. 07/13/18   [provider]  methotrexate (RHEUMATREX) 2.5 MG tablet Take 10 mg by mouth once a week.  06/03/16   [provider]  Multiple Vitamin (MULTI-VITAMINS) TABS Take 1 tablet by mouth daily.     [provider]  Multiple Vitamins-Minerals (PRESERVISION AREDS 2 PO) Take 1 tablet by mouth daily.     [provider]  Omega-3 Fatty Acids (FISH OIL) 1000 MG CAPS Take 1 capsule by mouth daily.  Patient not taking: Reported on 08/17/2020    [provider]  omeprazole (PRILOSEC) 20 MG capsule Take 20 mg by mouth daily.  11/07/14   [provider]  predniSONE (DELTASONE) 5 MG tablet Take 2.5 mg by mouth daily.  02/21/15   [provider]  promethazine (PHENERGAN) 25 MG tablet Take 12.5-25 mg by mouth every 6 (six) hours as needed for nausea or vomiting.  06/15/15   [provider]  SYNTHROID 100 MCG tablet Take 100 mcg by mouth daily. 03/17/19   [provider]  traMADol (ULTRAM) 50 MG tablet Take 50 mg by mouth 2 (two) times daily as needed (pain).  07/17/17   [provider]  vitamin E 400 UNIT capsule Take 400 Units by mouth daily.     [provider]     Objective    Physical Exam: Vitals:   02/22/21 1900 02/22/21 1915 02/22/21 2035 02/22/21 2056  BP: (!) 113/40   (!) 152/80  Pulse: 93 (!) 101  81  Resp:    20  Temp:   98.2 F (36.8  C) 98.2 F (36.8 C)  TempSrc:   Oral Oral  SpO2: 99% 97%  100%  Weight:      Height:        General: appears to be stated age; alert, oriented Skin: warm, dry, no rash Head:  AT/[place] Mouth:  Oral mucosa membranes appear moist, normal  dentition Neck: supple; trachea midline Heart:  RRR; did not appreciate any M/R/G Lungs: CTAB, did not appreciate any wheezes, rales, or rhonchi Abdomen: + BS; soft, ND, NT Vascular: 2+ pedal pulses b/l; 2+ radial pulses b/l Extremities: trace LLE edema; left calf tenderness noted; no muscle wasting Neuro: strength and sensation intact in upper and lower extremities b/l    Labs on Admission: I have personally reviewed following labs and imaging studies  CBC: Recent Labs  Lab 02/22/21 1727  WBC 5.6  NEUTROABS 3.6  HGB 11.6*  HCT 34.5*  MCV 92.7  PLT 277   Basic Metabolic Panel: Recent Labs  Lab 02/22/21 1727  NA 139  K 3.9  CL 103  CO2 27  GLUCOSE 104*  BUN 17  CREATININE <0.30*  CALCIUM 8.8*   GFR: CrCl cannot be calculated (This lab value cannot be used to calculate CrCl because it is not a number: <0.30). Liver Function Tests: No results for input(s): AST, ALT, ALKPHOS, BILITOT, PROT, ALBUMIN in the last 168 hours. No results for input(s): LIPASE, AMYLASE in the last 168 hours. No results for input(s): AMMONIA in the last 168 hours. Coagulation Profile: Recent Labs  Lab 02/22/21 1727  INR 1.0   Cardiac Enzymes: No results for input(s): CKTOTAL, CKMB, CKMBINDEX, TROPONINI in the last 168 hours. BNP (last 3 results) No results for input(s): PROBNP in the last 8760 hours. HbA1C: No results for input(s): HGBA1C in the last 72 hours. CBG: No results for input(s): GLUCAP in the last 168 hours. Lipid Profile: No results for input(s): CHOL, HDL, LDLCALC, TRIG, CHOLHDL, LDLDIRECT in the last 72 hours. Thyroid Function Tests: No results for input(s): TSH, T4TOTAL, FREET4, T3FREE, THYROIDAB in the last 72 hours. Anemia  Panel: No results for input(s): VITAMINB12, FOLATE, FERRITIN, TIBC, IRON, RETICCTPCT in the last 72 hours. Urine analysis:    Component Value Date/Time   COLORURINE YELLOW (A) 11/07/2019 0845   APPEARANCEUR CLEAR (A) 11/07/2019 0845   APPEARANCEUR Clear 04/13/2014 0802   LABSPEC 1.011 11/07/2019 0845   LABSPEC 1.005 04/13/2014 0802   PHURINE 7.0 11/07/2019 0845   GLUCOSEU NEGATIVE 11/07/2019 0845   GLUCOSEU Negative 04/13/2014 0802   HGBUR NEGATIVE 11/07/2019 0845   BILIRUBINUR NEGATIVE 11/07/2019 0845   BILIRUBINUR Negative 04/13/2014 0802   KETONESUR NEGATIVE 11/07/2019 0845   PROTEINUR NEGATIVE 11/07/2019 0845   UROBILINOGEN 0.2 02/15/2008 1115   NITRITE NEGATIVE 11/07/2019 0845   LEUKOCYTESUR NEGATIVE 11/07/2019 0845   LEUKOCYTESUR Negative 04/13/2014 0802    Radiological Exams on Admission: CT Angio Chest PE W and/or Wo Contrast  Result Date: 02/22/2021 CLINICAL DATA:  Positive for DVT, short of breath with exertion EXAM: CT ANGIOGRAPHY CHEST WITH CONTRAST TECHNIQUE: Multidetector CT imaging of the chest was performed using the standard protocol during bolus administration of intravenous contrast. Multiplanar CT image reconstructions and MIPs were obtained to evaluate the vascular anatomy. CONTRAST:  60mL OMNIPAQUE IOHEXOL 350 MG/ML SOLN COMPARISON:  CT chest 10/17/2017 FINDINGS: Cardiovascular: Satisfactory opacification of the pulmonary arteries to the segmental level. Extensive streak artifact from dense contrast within the SVC highly limits assessment of the central pulmonary arteries particularly right main and proximal lobar arteries. Allowing for this, suspected small filling defects within right upper lobe segmental and subsegmental pulmonary vessels, series 6, image number 88. No other definitive filling defects are visualized. Mild aortic atherosclerosis. No aneurysmal dilatation. Normal cardiac size. No pericardial effusion. Mediastinum/Nodes: Midline trachea. Heterogenous  thyroid with nodules, previously evaluated. No further workup recommended. No suspicious adenopathy.  Esophagus within normal limits. Lungs/Pleura: No acute consolidation or effusion. Minimal scarring at the apices. No pneumothorax. Scattered punctate nodules without significant change, therefore felt benign. Upper Abdomen: No acute abnormality. Musculoskeletal: No chest wall abnormality. No acute or significant osseous findings. Review of the MIP images confirms the above findings. IMPRESSION: 1. Extensive streak artifact from dense contrast within the SVC highly limits assessment of the central pulmonary arteries particularly the right main and proximal lobar arteries. Allowing for this, suspected small filling defects/pulmonary emboli within right upper lobe segmental and subsegmental pulmonary arteries. 2. Clear lung fields. 3. Aortic atherosclerosis. 4. Heterogenous thyroid with nodules, previously evaluated. Aortic Atherosclerosis (ICD10-I70.0). Critical Value/emergent results were called by telephone at the time of interpretation on 02/22/2021 at 6:59 pm to provider [facility] , who verbally acknowledged these results. Electronically Signed   By: Jasmine Pang M.D.   On: 02/22/2021 18:59   US Venous Img Lower Unilateral Left (DVT)  Result Date: 02/22/2021 CLINICAL DATA:  Left lower extremity edema EXAM: LEFT LOWER EXTREMITY VENOUS DOPPLER ULTRASOUND TECHNIQUE: Gray-scale sonography with graded compression, as well as color Doppler and duplex ultrasound were performed to evaluate the lower extremity deep venous systems from the level of the common femoral vein and including the common femoral, femoral, profunda femoral, popliteal and calf veins including the posterior tibial, peroneal and gastrocnemius veins when visible. The superficial great saphenous vein was also interrogated. Spectral Doppler was utilized to evaluate flow at rest and with distal augmentation maneuvers in the common femoral, femoral  and popliteal veins. COMPARISON:  None. FINDINGS: Contralateral Common Femoral Vein: Respiratory phasicity is normal and symmetric with the symptomatic side. No evidence of thrombus. Normal compressibility. Common Femoral Vein: No evidence of thrombus. Normal compressibility, respiratory phasicity and response to augmentation. Saphenofemoral Junction: No evidence of thrombus. Normal compressibility and flow on color Doppler imaging. Profunda Femoral Vein: Trace amount of intraluminal nonocclusive hypoechoic thrombus at the profunda femoral origin. Very minor thrombus burden. No propagation into the femoral vein. Femoral Vein: No evidence of thrombus. Normal compressibility, respiratory phasicity and response to augmentation. Popliteal Vein: No evidence of thrombus. Normal compressibility, respiratory phasicity and response to augmentation. Calf Veins: No evidence of thrombus. Normal compressibility and flow on color Doppler imaging. Other Findings: Right inguinal area complex hypoechoic mass measures 6.8 x 4.4 x 4.5 cm. The lesion is deep to the femoral vasculature and appears to overlie the hip joint. Lesion remains indeterminate by ultrasound. No associated vascularity to suggest pseudoaneurysm. Other considerations include bulky adenopathy or an indeterminate soft tissue mass related to the hip joint. Recommend further evaluation with left hip MRI without and with contrast. IMPRESSION: Nonocclusive profunda femoral vein origin thrombus. Very minimal thrombus burden. No propagation into the femoral veins. 6.8 cm right inguinal area indeterminate hypoechoic mass. See above comment and recommendation. These results will be called to the ordering clinician or representative by the Radiology Department at the imaging location. Electronically Signed   By: Judie Petit.  Shick M.D.   On: 02/22/2021 15:46     EKG: Independently reviewed, with result as described above.    Assessment/Plan   Kadelyn Dimascio is a 70  y.o. female with medical history significant for rheumatoid arthritis on chronic immunosuppressive therapy with Enbrel/methotrexate as well as chronic prednisone therapy, intermittent asthma, acquired hypothyroidism, essential hypertension, who is admitted to [facility] on 02/22/2021 with acute pulmonary emboli and acute left lower extremity DVT after presenting from home to [facility] ED complaining of left lower extremity  pain.    Principal Problem:   Acute pulmonary embolism (HCC) Active Problems:   Arthritis or polyarthritis, rheumatoid (HCC)   Adult hypothyroidism   Acute deep vein thrombosis (DVT) of left lower extremity (HCC)   GERD (gastroesophageal reflux disease)    #) Acute pulmonary emboli: In the context of 2 to 3 weeks of new onset shortness of breath, with presenting CTA chest demonstrating evidence suggestive of pulmonary emboli within the right upper lobe segmental and subsegmental pulmonary arteries.  This is in the context of diagnosis of acute left lower extremity DVT that was established earlier today as an outpatient, with suspicion of acute pulmonary embolism originating from this left lower extremity source given the relative sequence of her symptoms in which her left lower extremity symptoms consistent with DVT started few weeks prior to development of shortness of breath.  Appears to be the patient's first DVT/PE, and appears to be provoked in nature given potential endothelial injury stemming from recent ground-level mechanical fall and ensuing hip fracture during the last week of January 2022 as well as potential contribution from ensuing relative venous stasis due to a relative decline in her ambulatory status following this acute left hip fracture.  No evidence of associated cor pulmonale or hypoxia.  No evidence of associated hypotension to warrant consideration for TPA administration.  Overall, will require at least 3 months of anticoagulation. Risks versus  benefits of the various oral anticoagulant options, including warfarin, versus DOAC's were briefly discussed with the patient, but will require additional discussion tomorrow.  Of note, high-sensitivity troponin I was found to be nonelevated at 3 context of 2 to 3 weeks of shortness of breath, will presenting EKG showed no evidence of acute ischemic changes.  Not associate with any chest pain.  Following inpatient pharmacy consult, the patient received heparin bolus followed by initiation of heparin drip in the ED today.   Plan: Continue heparin drip overnight via inpatient pharmacy consult.  Anticipate conversion to oral anticoagulant in the morning.  With additional discussions regarding the risks versus benefits of the various available oral anticoagulant options.  Monitor on telemetry.  Monitor for development of hypotension.  As needed albuterol inhaler.  As needed Norco, of which the acetaminophen component may be beneficial for its anti-inflammatory effects.  Monitor continuous pulse oximetry.  Recheck CBC in the morning.  Incentive spirometry.       #) Acute left lower extremity DVT: Diagnosis on the basis of approximately 1 month of left calf tenderness with associated new onset edema, prompting outpatient left lower extremity venous ultrasound earlier today, which demonstrated nonocclusive femoral vein thrombus, as further noted above.  It appears provoked following recent fall/ensuing left hip fracture, as further described above.  Left lower extremity appears neurovascularly intact at this time.  Plan: Continue heparin drip overnight, as further described above, with plan to convert to oral anticoagulant in the morning, as further noted above.  As needed Norco.  Repeat CBC in the morning.      #) Rheumatoid arthritis: On chronic immunosuppression as an outpatient with Enbrel and q. weekly methotrexate.  Additionally, she is on chronic prednisone therapy as an outpatient.  She is also on  scheduled etodolac on a twice daily basis in addition to as needed Ultram.  Plan: Continue outpatient prednisone 2.5 mg p.o. daily.  Otherwise, continue existing outpatient management, as noted above.  Continue etodolac.  will hold home Ultram for now in the setting of initiation as needed Norco for additional anti-inflammatory  benefit due to constituent acetaminophen in the context of presenting acute DVT/PE, as further described above.      #) Intermittent asthma: No evidence of acute exacerbation at this time.  Plan: As needed albuterol inhaler.  Monitoring on continuous pulse oximetry as a component of evaluation for presenting acute pulmonary embolism.  Add on serum magnesium level.     #) Acquired hypothyroidism: On Synthroid as an outpatient.  Plan: Continue home Synthroid.     #) Essential hypertension: Outpatient antihypertensive regimen limited to HCTZ.  In the setting of presenting acute pulmonary embolism, associated with increased risk for hypotension, will hold home HCTZ for now while closely monitoring ensuing blood pressure.  Of note, blood pressures in the ED today have been normotensive, with no evidence of hypotension thus far.   Plan: Close monitoring of ensuing blood pressure via routine vital signs.  Hold home HCTZ for now, as further noted above.      #) GERD: On omeprazole at home.  Plan: Continue outpatient PPI.     DVT prophylaxis: Heparin drip per inpatient pharmacy consult Code Status: Full code Family Communication: none Disposition Plan: Per Rounding Team Consults called: none  Admission status: Observation; med telemetry    Of note, this patient was added by me to the following Admit List/Treatment Team: armcadmits.      PLEASE NOTE THAT DRAGON DICTATION SOFTWARE WAS USED IN THE CONSTRUCTION OF THIS NOTE.   Angie Fava DO [facility] Pager [phone] From 12PM - 12AM  Otherwise, please contact night-coverage   [email or web address] Password [facility]   02/22/2021, 9:01 PM

## 2021-02-23 ENCOUNTER — Observation Stay: Payer: Medicare Other

## 2021-02-23 DIAGNOSIS — M069 Rheumatoid arthritis, unspecified: Secondary | ICD-10-CM | POA: Diagnosis not present

## 2021-02-23 DIAGNOSIS — I2699 Other pulmonary embolism without acute cor pulmonale: Secondary | ICD-10-CM | POA: Diagnosis not present

## 2021-02-23 DIAGNOSIS — I82402 Acute embolism and thrombosis of unspecified deep veins of left lower extremity: Secondary | ICD-10-CM | POA: Diagnosis not present

## 2021-02-23 DIAGNOSIS — I82412 Acute embolism and thrombosis of left femoral vein: Secondary | ICD-10-CM | POA: Diagnosis not present

## 2021-02-23 LAB — BASIC METABOLIC PANEL
Anion gap: 6 (ref 5–15)
BUN: 15 mg/dL (ref 8–23)
CO2: 28 mmol/L (ref 22–32)
Calcium: 8.7 mg/dL — ABNORMAL LOW (ref 8.9–10.3)
Chloride: 104 mmol/L (ref 98–111)
Creatinine, Ser: 0.36 mg/dL — ABNORMAL LOW (ref 0.44–1.00)
GFR, Estimated: >60 mL/min (ref >60)
Glucose, Bld: 115 mg/dL — ABNORMAL HIGH (ref 70–99)
Potassium: 3.4 mmol/L — ABNORMAL LOW (ref 3.5–5.1)
Sodium: 138 mmol/L (ref 135–145)

## 2021-02-23 LAB — CBC
HCT: 33.2 % — ABNORMAL LOW (ref 36.0–46.0)
Hemoglobin: 10.5 g/dL — ABNORMAL LOW (ref 12.0–15.0)
MCH: 30 pg (ref 26.0–34.0)
MCHC: 31.6 g/dL (ref 30.0–36.0)
MCV: 94.9 fL (ref 80.0–100.0)
Platelets: 253 10*3/uL (ref 150–400)
RBC: 3.5 MIL/uL — ABNORMAL LOW (ref 3.87–5.11)
RDW: 15.9 % — ABNORMAL HIGH (ref 11.5–15.5)
WBC: 5.3 10*3/uL (ref 4.0–10.5)
nRBC: 0 % (ref 0.0–0.2)

## 2021-02-23 LAB — PHOSPHORUS: Phosphorus: 3.4 mg/dL (ref 2.5–4.6)

## 2021-02-23 LAB — MAGNESIUM: Magnesium: 1.9 mg/dL (ref 1.7–2.4)

## 2021-02-23 LAB — HEPARIN LEVEL (UNFRACTIONATED)
Heparin Unfractionated: 0.14 IU/mL — ABNORMAL LOW (ref 0.30–0.70)
Heparin Unfractionated: 0.27 IU/mL — ABNORMAL LOW (ref 0.30–0.70)

## 2021-02-23 LAB — HIV ANTIBODY (ROUTINE TESTING W REFLEX): HIV Screen 4th Generation wRfx: NONREACTIVE

## 2021-02-23 MED ORDER — APIXABAN (ELIQUIS) VTE STARTER PACK (10MG AND 5MG): ORAL_TABLET | ORAL | 0 refills | Status: DC

## 2021-02-23 MED ORDER — APIXABAN 5 MG PO TABS: 5.0000 mg | ORAL_TABLET | Freq: Two times a day (BID) | ORAL | Status: DC

## 2021-02-23 MED ORDER — GADOBUTROL 1 MMOL/ML IV SOLN
5.0000 mL | Freq: Once | INTRAVENOUS | Status: AC | PRN
  Administered 2021-02-23: 5 mL via INTRAVENOUS

## 2021-02-23 MED ORDER — HEPARIN BOLUS VIA INFUSION
1500.0000 [IU] | Freq: Once | INTRAVENOUS | Status: AC
  Administered 2021-02-23: 1500 [IU] via INTRAVENOUS
  Filled 2021-02-23: qty 1500

## 2021-02-23 MED ORDER — APIXABAN 5 MG PO TABS
10.0000 mg | ORAL_TABLET | Freq: Two times a day (BID) | ORAL | Status: DC
  Administered 2021-02-23: 10 mg via ORAL
  Filled 2021-02-23: qty 2

## 2021-02-23 MED ORDER — POTASSIUM CHLORIDE CRYS ER 10 MEQ PO TBCR
30.0000 meq | EXTENDED_RELEASE_TABLET | Freq: Once | ORAL | Status: AC
  Administered 2021-02-23: 30 meq via ORAL
  Filled 2021-02-23: qty 3

## 2021-02-23 NOTE — Progress Notes (Signed)
ANTICOAGULATION CONSULT NOTE  Pharmacy Consult for heparin gtt >> Eliquis Indication: pulmonary embolus  Allergies  Allergen Reactions  . B12-C-Fa-Fe [Iron-Vit C-Vit B12-Folic Acid] Shortness Of Breath  . Sulfa Antibiotics Other (See Comments)    Caused skin to come off in her mouth and down her throat.    . Vitamin B12 Anaphylaxis    When she takes a *HIGH DOSE*  . Alendronate Sodium     Other reaction(s): Other (See Comments) RECTAL BLEEDING  . Demerol [Meperidine] Nausea Only  . Diclofenac     Other reaction(s): Other (See Comments) MOUTH ULCERS  . Humira [Adalimumab] Swelling    brain  . Hydrocodone-Acetaminophen Nausea Only  . Ibandronic Acid Other (See Comments)    CONSTIPATION Other reaction(s): Other (See Comments) CONSTIPATION  . Morphine And Related Nausea And Vomiting  . Nalfon [Fenoprofen Calcium] Hives  . Remicade [Infliximab]     Drug induce lupus  . Sulfasalazine Other (See Comments)    Ulcer   . Imuran [Azathioprine] Rash  . Oxaprozin Rash  . Oxcarbazepine Rash    Skin peeling, like a sunburn    Patient Measurements: Height: 5' (152.4 cm) Weight: 53.4 kg (117 lb 11.6 oz) IBW/kg (Calculated) : 45.5 Heparin Dosing Weight: 52 kg  Vital Signs: Temp: 98.2 F (36.8 C) (04/01 0519) Temp Source: Oral (04/01 0519) BP: 128/76 (04/01 0519) Pulse Rate: 83 (04/01 0519)  Labs: Recent Labs    02/22/21 1727 02/22/21 1933 02/23/21 0206  HGB 11.6*  --  10.5*  HCT 34.5*  --  33.2*  PLT 277  --  253  APTT  --  33  --   LABPROT 12.6  --   --   INR 1.0  --   --   HEPARINUNFRC  --   --  0.14*  CREATININE <0.30*  --  0.36*  TROPONINIHS 3  --   --     Estimated Creatinine Clearance: 47.7 mL/min (A) (by C-G formula based on SCr of 0.36 mg/dL (L)).   Medical History: Past Medical History:  Diagnosis Date  . Asthma   . Collagen vascular disease (HCC)   . Cystocele    2nd degree  . Enterocele    early  . Gallstones 11/2018  . Muscle inflammation    . Rectocele    mild  . Rheumatoid arthritis (HCC)   . Vaginal atrophy     Assessment: 70 year old female presented with leg pain. Outpatient ultrasound positive for DVT. Also endorses SOB. CTA with suspected small filling defects/pulmonary emboli within right upper lobe segmental and subsegmental pulmonary arteries. Patient has not started any anticoagulation prior to evaluation in ED. Pharmacy consult for heparin now transition to [facility] (Eliquis).  Date Time HL Rate/Comment 4/1 0206 0.14 Subthera; 850 >1050 un/hr 4/1 0930 -- Will transition to OAC (Eliquis)  Goal of Therapy:  Heparin level 0.3-0.7 units/ml Monitor platelets by anticoagulation protocol: Yes   Plan:  Will discontinue heparin gtt and begin 1st dose Eliquis immediately after gtt stopped. (No therapeutic days to remove from acute phase.) Initiate Eliquis 10mg  BID x7d; then 5mg  BID thereafter.  CTM CBC daily while on therapeutic AC and inpatient.  , PharmD 02/23/2021,9:05 AM

## 2021-02-23 NOTE — Discharge Summary (Signed)
Physician Discharge Summary  Crystal [name] ZOX:[id number] DOB: 07-16-1951 DOA: 02/22/2021  PCP: Kandyce Rud, MD  Admit date: 02/22/2021 Discharge date: 02/23/2021   Admitted From: home Disposition:  home  Recommendations for Outpatient Follow-up:  1. Follow up with PCP in 1-2 weeks  Home Health: none Equipment/Devices: none  Discharge Condition: stable CODE STATUS: Full code Diet recommendation: regular  HPI: Per admitting MD, Crystal [name] is a 70 y.o. female with medical history significant for rheumatoid arthritis on chronic immunosuppressive therapy with Enbrel/methotrexate as well as chronic prednisone therapy, intermittent asthma, acquired hypothyroidism, essential hypertension, who is admitted to [facility] on 02/22/2021 with acute pulmonary emboli and acute left lower extremity DVT after presenting from home to [facility] ED complaining of left lower extremity pain. The patient reports that she experienced a ground-level mechanical fall during the last week of January 2022 during which she fell on her left hip.  Ensuing evaluation at that time reportedly found a small left pelvic fracture for which the patient has been following as an outpatient in orthopedic surgery clinic with [facility], has been managing this hip fracture nonoperatively. For the last 1 month, the patient reports development of left calf tenderness associated with mild edema in the absence of any significant erythematous changes  Hospital Course / Discharge diagnoses: Principal problem DVT / PE - provoked due to recent fall with pelvic fracture couple of months back, and decreased mobility. CT angiogram showed small filling defects/pulmonary emboli within right upper lobe segmental and subsegmental pulmonary arteries. DVT US showed nonocclusive profunda femoral vein origin thrombus with minimal thrombus burden and without propagation into the femoral veins. Patient has been  hemodynamically stable, on room air, was admitted to the hospital overnight on a heparin infusion. She tolerated anticoagulation well, no bleeding and eventually transitioned to Eliquis. Her Aspirin was discontinued. She is also on Enbrel which can potentially increase the risk of bleeding, she was a bit worried about discontinuing and will try and use it once daily for now.   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 D/w findings with the patient, she will further address with her orthopedic surgeon as an outpatient.  RA - resume home medications on dc Intermittent asthma - stable, no issues currently Hypothyroidism - continue home medications.   Sepsis ruled out   Discharge Instructions   Allergies as of 02/23/2021      Reactions   B12-c-fa-fe [iron-vit C-vit B12-folic Acid] Shortness Of Breath   Sulfa Antibiotics Other (See Comments)   Caused skin to come off in her mouth and down her throat.     Vitamin B12 Anaphylaxis   When she takes a *HIGH DOSE*   Alendronate Sodium    Other reaction(s): Other (See Comments) RECTAL BLEEDING   Demerol [meperidine] Nausea Only   Diclofenac    Other reaction(s): Other (See Comments) MOUTH ULCERS   Humira [adalimumab] Swelling   brain   Hydrocodone-acetaminophen Nausea Only   Ibandronic Acid Other (See Comments)   CONSTIPATION Other reaction(s): Other (See Comments) CONSTIPATION   Morphine And Related Nausea And Vomiting   Nalfon [fenoprofen Calcium] Hives   Remicade [  infliximab]    Drug induce lupus   Sulfasalazine Other (See Comments)   Ulcer   Imuran [azathioprine] Rash    Oxaprozin Rash   Oxcarbazepine Rash   Skin peeling, like a sunburn      Medication List    STOP taking these medications   aspirin EC 81 MG tablet     TAKE these medications   Apixaban Starter Pack (10mg  and 5mg ) Commonly known as: ELIQUIS STARTER PACK Take as directed on package: start with two-5mg  tablets twice daily for 7 days. On day 8, switch to one-5mg  tablet twice daily.   CALTRATE 600 PLUS-VIT D PO Take 1 tablet by mouth daily.   Enbrel 25 MG injection Generic drug: etanercept Inject 25 mg into the skin once a week.   etodolac 500 MG tablet Commonly known as: LODINE Take 500 mg by mouth 2 (two) times daily.   ferrous sulfate 325 (65 FE) MG tablet Take 325 mg by mouth daily with breakfast.   Fish Oil 1000 MG Caps Take 1 capsule by mouth daily.   hydrochlorothiazide 25 MG tablet Commonly known as: HYDRODIURIL Take 25 mg by mouth daily.   methotrexate 2.5 MG tablet Commonly known as: RHEUMATREX Take 10 mg by mouth once a week.   Multi-Vitamins Tabs Take 1 tablet by mouth daily.   omeprazole 20 MG capsule Commonly known as: PRILOSEC Take 20 mg by mouth daily.   predniSONE 5 MG tablet Commonly known as: DELTASONE Take 2.5 mg by mouth daily.   PRESERVISION AREDS 2 PO Take 1 tablet by mouth daily.   ProAir HFA 108 (90 Base) MCG/ACT inhaler Generic drug: albuterol Inhale into the lungs.   promethazine 25 MG tablet Commonly known as: PHENERGAN Take 12.5-25 mg by mouth every 6 (six) hours as needed for nausea or vomiting.   RA Vitamin B-12 TR 1000 MCG Tbcr Generic drug: Cyanocobalamin Take 1 tablet by mouth daily.   Synthroid 100 MCG tablet Generic drug: levothyroxine Take 100 mcg by mouth daily.   traMADol 50 MG tablet Commonly known as: ULTRAM Take 50 mg by mouth 2 (two) times daily as needed (pain).   vitamin E 180 MG (400 UNITS) capsule Take 400 Units by mouth daily.      Consultations:  None   Procedures/Studies:  CT Angio Chest  PE W and/or Wo Contrast  Result Date: 02/22/2021 CLINICAL DATA:  Positive for DVT, short of breath with exertion EXAM: CT ANGIOGRAPHY CHEST WITH CONTRAST TECHNIQUE: Multidetector CT imaging of the chest was performed using the standard protocol during bolus administration of intravenous contrast. Multiplanar CT image reconstructions and MIPs were obtained to evaluate the vascular anatomy. CONTRAST:  47mL OMNIPAQUE IOHEXOL 350 MG/ML SOLN COMPARISON:  CT chest 10/17/2017 FINDINGS: Cardiovascular: Satisfactory opacification of the pulmonary arteries to the segmental level. Extensive streak artifact from dense contrast within the SVC highly limits assessment of the central pulmonary arteries particularly right main and proximal lobar arteries. Allowing for this, suspected small filling defects within right upper lobe segmental and subsegmental pulmonary vessels, series 6, image number 88. No other definitive filling defects are visualized. Mild aortic atherosclerosis. No aneurysmal dilatation. Normal cardiac size. No pericardial effusion. Mediastinum/Nodes: Midline trachea. Heterogenous thyroid with nodules, previously evaluated. No further workup recommended. No suspicious adenopathy. Esophagus within normal limits. Lungs/Pleura: No acute consolidation or effusion. Minimal scarring at the apices. No pneumothorax. Scattered punctate nodules without significant change, therefore felt benign. Upper Abdomen: No acute abnormality. Musculoskeletal: No chest wall abnormality. No acute or significant  osseous findings. Review of the MIP images confirms the above findings. IMPRESSION: 1. Extensive streak artifact from dense contrast within the SVC highly limits assessment of the central pulmonary arteries particularly the right main and proximal lobar arteries. Allowing for this, suspected small filling defects/pulmonary emboli within right upper lobe segmental and subsegmental pulmonary arteries. 2. Clear lung fields. 3.  Aortic atherosclerosis. 4. Heterogenous thyroid with nodules, previously evaluated. Aortic Atherosclerosis (ICD10-I70.0). Critical Value/emergent results were called by telephone at the time of interpretation on 02/22/2021 at 6:59 pm to provider [facility] , who verbally acknowledged these results. Electronically Signed   By: Jasmine Pang M.D.   On: 02/22/2021 18:59   MR HIP LEFT W WO CONTRAST  Result Date: 02/23/2021 CLINICAL DATA:  Solid-appearing lesion about the left hip on ultrasound for DVT. EXAM: MRI OF THE LEFT HIP WITHOUT AND WITH CONTRAST TECHNIQUE: Multiplanar, multisequence MR imaging of the left hip was performed both before and after administration of intravenous contrast. CONTRAST:  5 mL GADAVIST IV SOLN COMPARISON:  CT abdomen and pelvis 05/01/2018. Doppler ultrasound left leg 02/22/2021. FINDINGS: Bones/Joint/Cartilage Artifact reduction techniques were used in this patient with bilateral total hip arthroplasties. There is a collection measuring approximately 9 cm transverse by 5 cm AP by 4 cm craniocaudal centered about the left hip which extends through the medial wall of the acetabulum into the pelvis and peripherally into the soft tissues adjacent to the hip. Components of the collection are also seen anterior and posterior to the right hip. Larger component is anterior and measures 5 cm AP by 4.2 cm transverse by 6 cm craniocaudal. No abnormal enhancement is identified. These collections likely communicate and there is debris within them. No internal enhancement. A smaller fluid collection is seen about the right hip and the medial wall of the right acetabulum is deficient. No acute fracture is identified. Intermediate decreased T1 signal, heterogeneously increased T2 signal and mild enhancement are seen in the left ilium adjacent to the sacrum. 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 Ligaments Grossly intact. Muscles and Tendons Severe fatty and moderately severe atrophy of the left gluteus minimus  fatty atrophy of the left gluteus maximus. Visualized left iliacus is also severely atrophic. Soft tissues Negative. IMPRESSION: Negative for neoplastic process. 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 Findings are present on the 2019 CT scan but appear worse. Edema and enhancement in the left ilium adjacent to the SI joint is likely related to particle disease and may represent stress change. Fluid collection in the right hip and marked thinning of the medial wall the right acetabulum consistent with particle disease. Changes are much less severe than those seen on the left. Fatty atrophy of musculature of the left pelvic girdle. No tendon tear is identified and finding may be due to altered mechanics/disuse. Ankylosed SI joints, unchanged. Electronically Signed   By: Drusilla Kanner M.D.   On: 02/23/2021 14:04   US Venous Img Lower Unilateral Left (DVT)  Addendum Date: 02/23/2021   ADDENDUM REPORT: 02/23/2021 09:27 ADDENDUM: Correction to findings and impression LEFT inguinal area complex hypoechoic mass measures 6.8 x 4.4 x 4.5 cm. This remains indeterminate by ultrasound. Recommend further evaluation with LEFT hip MRI without and with contrast These results were called by telephone at the time of interpretation on 02/23/2021 at 9:25 am to provider Dr Dan Europe, who verbally acknowledged these results. Electronically Signed  By: Osvaldo Shipper M.D.   On: 02/23/2021 09:27   Result Date: 02/23/2021 CLINICAL DATA:  Left lower extremity edema EXAM: LEFT LOWER EXTREMITY VENOUS DOPPLER ULTRASOUND TECHNIQUE: Gray-scale sonography with graded compression, as well as color Doppler and duplex ultrasound were performed to evaluate the lower extremity deep venous systems from the level of the common femoral vein and including the common femoral, femoral, profunda femoral, popliteal  and calf veins including the posterior tibial, peroneal and gastrocnemius veins when visible. The superficial great saphenous vein was also interrogated. Spectral Doppler was utilized to evaluate flow at rest and with distal augmentation maneuvers in the common femoral, femoral and popliteal veins. COMPARISON:  None. FINDINGS: Contralateral Common Femoral Vein: Respiratory phasicity is normal and symmetric with the symptomatic side. No evidence of thrombus. Normal compressibility. Common Femoral Vein: No evidence of thrombus. Normal compressibility, respiratory phasicity and response to augmentation. Saphenofemoral Junction: No evidence of thrombus. Normal compressibility and flow on color Doppler imaging. Profunda Femoral Vein: Trace amount of intraluminal nonocclusive hypoechoic thrombus at the profunda femoral origin. Very minor thrombus burden. No propagation into the femoral vein. Femoral Vein: No evidence of thrombus. Normal compressibility, respiratory phasicity and response to augmentation. Popliteal Vein: No evidence of thrombus. Normal compressibility, respiratory phasicity and response to augmentation. Calf Veins: No evidence of thrombus. Normal compressibility and flow on color Doppler imaging. Other Findings: Right inguinal area complex hypoechoic mass measures 6.8 x 4.4 x 4.5 cm. The lesion is deep to the femoral vasculature and appears to overlie the hip joint. Lesion remains indeterminate by ultrasound. No associated vascularity to suggest pseudoaneurysm. Other considerations include bulky adenopathy or an indeterminate soft tissue mass related to the hip joint. Recommend further evaluation with left hip MRI without and with contrast. IMPRESSION: Nonocclusive profunda femoral vein origin thrombus. Very minimal thrombus burden. No propagation into the femoral veins. 6.8 cm right inguinal area indeterminate hypoechoic mass. See above comment and recommendation. These results will be called to the  ordering clinician or representative by the Radiology Department at the imaging location. Electronically Signed: By: Judie Petit.  Shick M.D. On: 02/22/2021 15:46     Subjective: - no chest pain, shortness of breath, no abdominal pain, nausea or vomiting.   Discharge Exam: BP (!) 143/77 (BP Location: Left Arm)   Pulse 94   Temp 98.4 F (36.9 C) (Oral)   Resp 18   Ht 5' (1.524 m)   Wt 53.4 kg   SpO2 96%   BMI 22.99 kg/m   General: Pt is alert, awake, not in acute distress Cardiovascular: RRR, S1/S2 +, no rubs, no gallops Respiratory: CTA bilaterally, no wheezing, no rhonchi Abdominal: Soft, NT, ND, bowel sounds + Extremities: no edema, no cyanosis  The results of significant diagnostics from this hospitalization (including imaging, microbiology, ancillary and laboratory) are listed below for reference.     Microbiology: Recent Results (from the past 240 hour(s))  Resp Panel by RT-PCR (Flu A&B, Covid) Nasopharyngeal Swab     Status: None   Collection Time: 02/22/21  5:27 PM   Specimen: Nasopharyngeal Swab; Nasopharyngeal(NP) swabs in vial transport medium  Result Value Ref Range Status   SARS Coronavirus 2 by RT PCR NEGATIVE NEGATIVE Final    Comment: (NOTE) SARS-CoV-2 target nucleic acids are NOT DETECTED.  The SARS-CoV-2 RNA is generally detectable in upper respiratory specimens during the acute phase of infection. The lowest concentration of SARS-CoV-2 viral copies this assay can detect is 138 copies/mL. A negative result does not preclude SARS-Cov-2 infection  and should not be used as the sole basis for treatment or other patient management decisions. A negative result may occur with  improper specimen collection/handling, submission of specimen other than nasopharyngeal swab, presence of viral mutation(s) within the areas targeted by this assay, and inadequate number of viral copies(<138 copies/mL). A negative result must be combined with clinical observations, patient  history, and epidemiological information. The expected result is Negative.  Fact Sheet for Patients:  BloggerCourse.com  Fact Sheet for Healthcare Providers:  SeriousBroker.it  This test is no t yet approved or cleared by the [place] FDA and  has been authorized for detection and/or diagnosis of SARS-CoV-2 by FDA under an Emergency Use Authorization (EUA). This EUA will remain  in effect (meaning this test can be used) for the duration of the COVID-19 declaration under Section 564(b)(1) of the Act, 21 U.S.C.section 360bbb-3(b)(1), unless the authorization is terminated  or revoked sooner.       Influenza A by PCR NEGATIVE NEGATIVE Final   Influenza B by PCR NEGATIVE NEGATIVE Final    Comment: (NOTE) The Xpert Xpress SARS-CoV-2/FLU/RSV plus assay is intended as an aid in the diagnosis of influenza from Nasopharyngeal swab specimens and should not be used as a sole basis for treatment. Nasal washings and aspirates are unacceptable for Xpert Xpress SARS-CoV-2/FLU/RSV testing.  Fact Sheet for Patients: BloggerCourse.com  Fact Sheet for Healthcare Providers: SeriousBroker.it  This test is not yet approved or cleared by the [place] FDA and has been authorized for detection and/or diagnosis of SARS-CoV-2 by FDA under an Emergency Use Authorization (EUA). This EUA will remain in effect (meaning this test can be used) for the duration of the COVID-19 declaration under Section 564(b)(1) of the Act, 21 U.S.C. section 360bbb-3(b)(1), unless the authorization is terminated or revoked.  Performed at [facility], [address] [place], [place] [address]      Labs: Basic Metabolic Panel: Recent Labs  Lab 02/22/21 1727 02/23/21 0206  NA 139 138  K 3.9 3.4*  CL 103 104  CO2 27 28  GLUCOSE 104* 115*  BUN 17 15  CREATININE <0.30* 0.36*  CALCIUM 8.8* 8.7*   MG 2.1 1.9  PHOS  --  3.4   Liver Function Tests: No results for input(s): AST, ALT, ALKPHOS, BILITOT, PROT, ALBUMIN in the last 168 hours. CBC: Recent Labs  Lab 02/22/21 1727 02/23/21 0206  WBC 5.6 5.3  NEUTROABS 3.6  --   HGB 11.6* 10.5*  HCT 34.5* 33.2*  MCV 92.7 94.9  PLT 277 253   CBG: No results for input(s): GLUCAP in the last 168 hours. Hgb A1c No results for input(s): HGBA1C in the last 72 hours. Lipid Profile No results for input(s): CHOL, HDL, LDLCALC, TRIG, CHOLHDL, LDLDIRECT in the last 72 hours. Thyroid function studies No results for input(s): TSH, T4TOTAL, T3FREE, THYROIDAB in the last 72 hours.  Invalid input(s): FREET3 Urinalysis    Component Value Date/Time   COLORURINE YELLOW (A) 11/07/2019 0845   APPEARANCEUR CLEAR (A) 11/07/2019 0845   APPEARANCEUR Clear 04/13/2014 0802   LABSPEC 1.011 11/07/2019 0845   LABSPEC 1.005 04/13/2014 0802   PHURINE 7.0 11/07/2019 0845   GLUCOSEU NEGATIVE 11/07/2019 0845   GLUCOSEU Negative 04/13/2014 0802   HGBUR NEGATIVE 11/07/2019 0845   BILIRUBINUR NEGATIVE 11/07/2019 0845   BILIRUBINUR Negative 04/13/2014 0802   KETONESUR NEGATIVE 11/07/2019 0845   PROTEINUR NEGATIVE 11/07/2019 0845   UROBILINOGEN 0.2 02/15/2008 1115   NITRITE NEGATIVE 11/07/2019 0845   LEUKOCYTESUR  NEGATIVE 11/07/2019 0845   LEUKOCYTESUR Negative 04/13/2014 0802    FURTHER DISCHARGE INSTRUCTIONS:   Get Medicines reviewed and adjusted: Please take all your medications with you for your next visit with your Primary MD   Laboratory/radiological data: Please request your Primary MD to go over all hospital tests and procedure/radiological results at the follow up, please ask your Primary MD to get all Hospital records sent to his/her office.   In some cases, they will be blood work, cultures and biopsy results pending at the time of your discharge. Please request that your primary care M.D. goes through all the records of your hospital data  and follows up on these results.   Also Note the following: If you experience worsening of your admission symptoms, develop shortness of breath, life threatening emergency, suicidal or homicidal thoughts you must seek medical attention immediately by calling 911 or calling your MD immediately  if symptoms less severe.   You must read complete instructions/literature along with all the possible adverse reactions/side effects for all the Medicines you take and that have been prescribed to you. Take any new Medicines after you have completely understood and accpet all the possible adverse reactions/side effects.    Do not drive when taking Pain medications or sleeping medications (Benzodaizepines)   Do not take more than prescribed Pain, Sleep and Anxiety Medications. It is not advisable to combine anxiety,sleep and pain medications without talking with your primary care practitioner   Special Instructions: If you have smoked or chewed Tobacco  in the last 2 yrs please stop smoking, stop any regular Alcohol  and or any Recreational drug use.   Wear Seat belts while driving.   Please note: You were cared for by a hospitalist during your hospital stay. Once you are discharged, your primary care physician will handle any further medical issues. Please note that NO REFILLS for any discharge medications will be authorized once you are discharged, as it is imperative that you return to your primary care physician (or establish a relationship with a primary care physician if you do not have one) for your post hospital discharge needs so that they can reassess your need for medications and monitor your lab values.  Time coordinating discharge: 40 minutes  SIGNED:  Pamella Pert, MD, PhD 02/23/2021, 2:40 PM

## 2021-02-23 NOTE — Progress Notes (Signed)
Pt's PIV removed with tip intact. Discharge instructions explained with pt. Pt denies any questions or concerns. Pt to pick up meds from home pharmacy.   

## 2021-02-23 NOTE — Progress Notes (Signed)
Per MD, patient to discharge home today.   Requested benefits check for Eliquis.  Also left Eliquis 30 day coupon for patient in DC Packet.   Alfonso Ramus, [place] [phone]

## 2021-02-23 NOTE — Plan of Care (Signed)

## 2021-02-23 NOTE — Progress Notes (Signed)
ANTICOAGULATION CONSULT NOTE  Pharmacy Consult for heparin Indication: pulmonary embolus  Allergies  Allergen Reactions  . B12-C-Fa-Fe [Iron-Vit C-Vit B12-Folic Acid] Shortness Of Breath  . Sulfa Antibiotics Other (See Comments)    Caused skin to come off in her mouth and down her throat.    . Vitamin B12 Anaphylaxis    When she takes a *HIGH DOSE*  . Alendronate Sodium     Other reaction(s): Other (See Comments) RECTAL BLEEDING  . Demerol [Meperidine] Nausea Only  . Diclofenac     Other reaction(s): Other (See Comments) MOUTH ULCERS  . Humira [Adalimumab] Swelling    brain  . Hydrocodone-Acetaminophen Nausea Only  . Ibandronic Acid Other (See Comments)    CONSTIPATION Other reaction(s): Other (See Comments) CONSTIPATION  . Morphine And Related Nausea And Vomiting  . Nalfon [Fenoprofen Calcium] Hives  . Remicade [Infliximab]     Drug induce lupus  . Sulfasalazine Other (See Comments)    Ulcer   . Imuran [Azathioprine] Rash  . Oxaprozin Rash  . Oxcarbazepine Rash    Skin peeling, like a sunburn    Patient Measurements: Height: 5' (152.4 cm) Weight: 53.1 kg (117 lb 1 oz) IBW/kg (Calculated) : 45.5 Heparin Dosing Weight: 52 kg  Vital Signs: Temp: 98.2 F (36.8 C) (03/31 2056) Temp Source: Oral (03/31 2056) BP: 152/80 (03/31 2056) Pulse Rate: 81 (03/31 2056)  Labs: Recent Labs    02/22/21 1727 02/22/21 1933 02/23/21 0206  HGB 11.6*  --  10.5*  HCT 34.5*  --  33.2*  PLT 277  --  253  APTT  --  33  --   LABPROT 12.6  --   --   INR 1.0  --   --   HEPARINUNFRC  --   --  0.14*  CREATININE <0.30*  --  0.36*  TROPONINIHS 3  --   --     Estimated Creatinine Clearance: 47.7 mL/min (A) (by C-G formula based on SCr of 0.36 mg/dL (L)).   Medical History: Past Medical History:  Diagnosis Date  . Asthma   . Collagen vascular disease (HCC)   . Cystocele    2nd degree  . Enterocele    early  . Gallstones 11/2018  . Muscle inflammation   . Rectocele     mild  . Rheumatoid arthritis (HCC)   . Vaginal atrophy     Assessment: 70 year old female presented with leg pain. Outpatient ultrasound positive for DVT. Also endorses SOB. CTA with suspected small filling defects/pulmonary emboli within right upper lobe segmental and subsegmental pulmonary arteries. Patient has not started any anticoagulation prior to evaluation in ED. Pharmacy consult for heparin.  Goal of Therapy:  Heparin level 0.3-0.7 units/ml Monitor platelets by anticoagulation protocol: Yes   Plan:  4/1:  HL @ 0206 = 0.14 Will order heparin 1500 units IV X 1 bolus and increase drip rate to 1050 units/hr. Will recheck HL 6 hrs after rate change.   Syniyah Bourne D, PharmD 02/23/2021,2:47 AM

## 2021-02-23 NOTE — TOC Benefit Eligibility Note (Signed)
Transition of Care [facility]) Benefit Eligibility Note    Patient Details  Name: Kenslee Achorn MRN: [id number] Date of Birth: Jul 22, 1951   Medication/Dose: 10 mg BID x 7 days the 5mg  BID for 30 day supply  Covered?: Yes  Tier: 3 Drug  Prescription Coverage Preferred Pharmacy: local pharmacy  Spoke with Person/Company/Phone Number:: Sabrina/ [employer] [phone]  Co-Pay: $527.00 due to Patient still has a $480.00 deductable to meet then copay will be $47.00  Prior Approval: No (Patient an call [phone] and request Tier Exception to copay reduced)  Deductible: Unmet (still $480.00 to meet)       [phone] Phone Number: 02/23/2021, 1:14 PM

## 2021-06-15 ENCOUNTER — Other Ambulatory Visit: Payer: Self-pay | Admitting: Family Medicine

## 2021-06-15 DIAGNOSIS — Z1231 Encounter for screening mammogram for malignant neoplasm of breast: Secondary | ICD-10-CM

## 2021-06-22 ENCOUNTER — Other Ambulatory Visit: Payer: Self-pay

## 2021-06-22 ENCOUNTER — Ambulatory Visit
Admission: RE | Admit: 2021-06-22 | Discharge: 2021-06-22 | Disposition: A | Discharge status: Home / Self Care | Payer: Medicare Other | Source: Ambulatory Visit | Attending: Student | Admitting: Student

## 2021-06-22 ENCOUNTER — Other Ambulatory Visit: Payer: Self-pay | Admitting: Student

## 2021-06-22 DIAGNOSIS — R2241 Localized swelling, mass and lump, right lower limb: Secondary | ICD-10-CM | POA: Diagnosis not present

## 2021-07-23 ENCOUNTER — Other Ambulatory Visit: Payer: Self-pay

## 2021-07-23 ENCOUNTER — Ambulatory Visit
Admission: RE | Admit: 2021-07-23 | Discharge: 2021-07-23 | Disposition: A | Discharge status: Home / Self Care | Payer: Medicare Other | Source: Ambulatory Visit | Attending: Family Medicine | Admitting: Family Medicine

## 2021-07-23 DIAGNOSIS — Z1231 Encounter for screening mammogram for malignant neoplasm of breast: Secondary | ICD-10-CM

## 2021-08-01 ENCOUNTER — Encounter: Payer: Self-pay | Admitting: Obstetrics and Gynecology

## 2021-08-01 ENCOUNTER — Ambulatory Visit (INDEPENDENT_AMBULATORY_CARE_PROVIDER_SITE_OTHER): Payer: Medicare Other | Admitting: Obstetrics and Gynecology

## 2021-08-01 ENCOUNTER — Other Ambulatory Visit: Payer: Self-pay

## 2021-08-01 VITALS — BP 147/86 | HR 78 | Ht 61.0 in | Wt 114.3 lb

## 2021-08-01 DIAGNOSIS — Z01419 Encounter for gynecological examination (general) (routine) without abnormal findings: Secondary | ICD-10-CM | POA: Diagnosis not present

## 2021-08-01 NOTE — Progress Notes (Signed)
HPI:      [name] is a 70 y.o. [phone] who LMP was No LMP recorded. Patient has had a hysterectomy.  Subjective:   She presents today for her annual examination.  Her PCP does her entire exam except for pelvic. Of significant note patient has a history of cystocele and previous cystocele repair.  She has no complaints today. She has significant rheumatoid arthritis in multiple joints.  Has had bilateral knee replacement surgery, hip replacement surgery and spinal fusion in her neck.  She recently fell and had a pelvic fracture with subsequent DVT and PE.  She has now completed her 1 year course of anticoagulation. She is up-to-date on mammography. She has had a hysterectomy    Hx: The following portions of the patient's history were reviewed and updated as appropriate:             She  has a past medical history of Asthma, Collagen vascular disease (HCC), Cystocele, Enterocele, Gallstones (11/2018), Muscle inflammation, Rectocele, Rheumatoid arthritis (HCC), and Vaginal atrophy. She does not have any pertinent problems on file. She  has a past surgical history that includes Total hip arthroplasty (Bilateral, [id number]); hand surgery (2002); Foot surgery (Bilateral, 1993); knee replacement (Bilateral, 1993); neck fusion (2000); Thyroid surgery (1999); Shoulder arthroscopy (2001); Anterior and posterior vaginal repair (2009); Abdominal hysterectomy (1980); Carpal tunnel release (Left); Wrist surgery (Left, 1986); Temporomandibular joint arthroplasty (1991); Elbow surgery; and Hip Closed Reduction (Right, 05/02/2019). Her family history includes Breast cancer in her cousin and maternal aunt; Breast cancer (age of onset: 59) in her paternal aunt; Breast cancer (age of onset: 4) in her maternal aunt; Colon cancer in her cousin; Diabetes in her brother, mother, and paternal aunt. She  reports that she has never smoked. She has never used smokeless tobacco. She reports that she does not  drink alcohol and does not use drugs. She has a current medication list which includes the following prescription(s): albuterol, apixaban starter pack (10mg  and 5mg ), calcium-vitamin d, cyanocobalamin, etanercept, etodolac, ferrous sulfate, hydrochlorothiazide, methotrexate, multi-vitamins, multiple vitamins-minerals, fish oil, omeprazole, prednisone, promethazine, synthroid, tramadol, and vitamin e. She is allergic to b12-c-fa-fe [iron-vit c-vit b12-folic acid], sulfa antibiotics, vitamin b12, alendronate sodium, demerol [meperidine], diclofenac, humira [adalimumab], hydrocodone-acetaminophen, ibandronic acid, morphine and related, nalfon [fenoprofen calcium], remicade [infliximab], sulfasalazine, imuran [azathioprine], oxaprozin, and oxcarbazepine.       Review of Systems:  Review of Systems  Constitutional: Denied constitutional symptoms, night sweats, recent illness, fatigue, fever, insomnia and weight loss.  Eyes: Denied eye symptoms, eye pain, photophobia, vision change and visual disturbance.  Ears/Nose/Throat/Neck: Denied ear, nose, throat or neck symptoms, hearing loss, nasal discharge, sinus congestion and sore throat.  Cardiovascular: Denied cardiovascular symptoms, arrhythmia, chest pain/pressure, edema, exercise intolerance, orthopnea and palpitations.  Respiratory: Denied pulmonary symptoms, asthma, pleuritic pain, productive sputum, cough, dyspnea and wheezing.  Gastrointestinal: Denied, gastro-esophageal reflux, melena, nausea and vomiting.  Genitourinary: Denied genitourinary symptoms including symptomatic vaginal discharge, pelvic relaxation issues, and urinary complaints.  Musculoskeletal: Denied musculoskeletal symptoms, stiffness, swelling, muscle weakness and myalgia.  Dermatologic: Denied dermatology symptoms, rash and scar.  Neurologic: Denied neurology symptoms, dizziness, headache, neck pain and syncope.  Psychiatric: Denied psychiatric symptoms, anxiety and depression.   Endocrine: Denied endocrine symptoms including hot flashes and night sweats.   Meds:   Current Outpatient Medications on File Prior to Visit  Medication Sig Dispense Refill   albuterol (VENTOLIN HFA) 108 (90 Base) MCG/ACT inhaler Inhale into the lungs. (Patient not taking: No sig  reported)     APIXABAN (ELIQUIS) VTE STARTER PACK (10MG  AND 5MG ) Take as directed on package: start with two-5mg  tablets twice daily for 7 days. On day 8, switch to one-5mg  tablet twice daily. 1 each 0   Calcium-Vitamin D (CALTRATE 600 PLUS-VIT D PO) Take 1 tablet by mouth daily.      Cyanocobalamin (RA VITAMIN B-12 TR) 1000 MCG TBCR Take 1 tablet by mouth daily.      etanercept (ENBREL) 25 MG injection Inject 25 mg into the skin once a week.      etodolac (LODINE) 500 MG tablet Take 500 mg by mouth 2 (two) times daily.      ferrous sulfate 325 (65 FE) MG tablet Take 325 mg by mouth daily with breakfast.     hydrochlorothiazide (HYDRODIURIL) 25 MG tablet Take 25 mg by mouth daily. (Patient not taking: Reported on 08/01/2021)     methotrexate (RHEUMATREX) 2.5 MG tablet Take 10 mg by mouth once a week.  (Patient not taking: Reported on 08/01/2021)     Multiple Vitamin (MULTI-VITAMINS) TABS Take 1 tablet by mouth daily.      Multiple Vitamins-Minerals (PRESERVISION AREDS 2 PO) Take 1 tablet by mouth daily.      Omega-3 Fatty Acids (FISH OIL) 1000 MG CAPS Take 1 capsule by mouth daily.  (Patient not taking: No sig reported)     omeprazole (PRILOSEC) 20 MG capsule Take 20 mg by mouth daily.      predniSONE (DELTASONE) 5 MG tablet Take 2.5 mg by mouth daily.      promethazine (PHENERGAN) 25 MG tablet Take 12.5-25 mg by mouth every 6 (six) hours as needed for nausea or vomiting.      SYNTHROID 100 MCG tablet Take 100 mcg by mouth daily. (Patient not taking: Reported on 08/01/2021)     traMADol (ULTRAM) 50 MG tablet Take 50 mg by mouth 2 (two) times daily as needed (pain).      vitamin E 400 UNIT capsule Take 400 Units by mouth  daily.      No current facility-administered medications on file prior to visit.      Objective:     Vitals:   08/01/21 1102  BP: (!) 147/86  Pulse: 78    Filed Weights   08/01/21 1102  Weight: 114 lb 4.8 oz (51.8 kg)              Physical examination General NAD, Conversant  HEENT Atraumatic; Op clear with mmm.  Normo-cephalic. Pupils reactive. Anicteric sclerae  Thyroid/Neck Smooth without nodularity or enlargement. Normal ROM.  Neck Supple.  Skin No rashes, lesions or ulceration. Normal palpated skin turgor. No nodularity.  Breasts: No masses or discharge.  Symmetric.  No axillary adenopathy.  Lungs: Clear to auscultation.No rales or wheezes. Normal Respiratory effort, no retractions.  Heart: NSR.  No murmurs or rubs appreciated. No periferal edema  Abdomen: Soft.  Non-tender.  No masses.  No HSM. No hernia  Extremities: Moves all appropriately.  Normal ROM for age. No lymphadenopathy.  Neuro: Oriented to PPT.  Normal mood. Normal affect.     Pelvic:   Vulva: Normal appearance.  No lesions.   Vagina: No lesions or abnormalities noted.  Atrophy noted  Support: Third-degree cystocele  Urethra No masses tenderness or scarring.  Meatus Normal size without lesions or prolapse.  Cervix: Surgically absent   Anus: Normal exam.  No lesions.  Perineum: Normal exam.  No lesions.        Bimanual  Uterus: Surgically absent   Adnexae: No masses.  Non-tender to palpation.  Cul-de-sac: Negative for abnormality.     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 Encounter for gynecological examination without abnormal finding     Patient generally has a normal exam.  She does have a cystocele but this has been repaired before and she is not having any issues.   Plan:            1.  Basic Screening Recommendations The basic screening recommendations for asymptomatic women were discussed with the patient during her visit.  The age-appropriate recommendations were discussed with her and the rational for the tests reviewed.  When I am informed by the patient that another primary care physician has previously obtained the age-appropriate tests and they are up-to-date, only outstanding tests are ordered and referrals given as necessary.  Abnormal results of tests will be discussed with her when all of her results are completed.  Routine preventative health maintenance measures emphasized: Exercise/Diet/Weight control, Tobacco Warnings, Alcohol/Substance use risks and Stress Management  Orders No orders of the defined types were placed in this encounter.   No orders of the defined types were placed in this encounter.         F/U  Return in about 1 year (around 08/01/2022) for Annual Physical. I spent 23 minutes involved in the care of this patient preparing to see the patient by obtaining and reviewing her medical history (including labs, imaging tests and prior procedures), documenting clinical information in the electronic health record (EHR), counseling and coordinating  care plans, writing and sending prescriptions, ordering tests or procedures and in direct communicating with the patient and medical staff discussing pertinent items from her history and physical exam.  Elonda Husky, M.D. 08/01/2021 11:23 AM

## 2021-09-03 ENCOUNTER — Ambulatory Visit (INDEPENDENT_AMBULATORY_CARE_PROVIDER_SITE_OTHER): Payer: Medicare Other | Admitting: Obstetrics and Gynecology

## 2021-09-03 ENCOUNTER — Encounter: Payer: Self-pay | Admitting: Obstetrics and Gynecology

## 2021-09-03 ENCOUNTER — Other Ambulatory Visit: Payer: Self-pay

## 2021-09-03 VITALS — BP 135/83 | HR 73 | Resp 16 | Ht 61.0 in | Wt 115.2 lb

## 2021-09-03 DIAGNOSIS — N952 Postmenopausal atrophic vaginitis: Secondary | ICD-10-CM

## 2021-09-03 DIAGNOSIS — N950 Postmenopausal bleeding: Secondary | ICD-10-CM | POA: Diagnosis not present

## 2021-09-03 MED ORDER — ESTRADIOL 0.1 MG/GM VA CREA: 0.2500 | TOPICAL_CREAM | Freq: Every day | VAGINAL | 3 refills | Status: AC

## 2021-09-03 NOTE — Progress Notes (Signed)
HPI:      [name] is a 70 y.o. [phone] who LMP was No LMP recorded. Patient has had a hysterectomy.  Subjective:   She presents today stating that over the last 5 days she has had approximately 3 episodes of vaginal bleeding/spotting.  She reports that it is not really bleeding just a little bit of pinkish-red discharge.  She is sure that is coming from the vagina.  She has no pain.  She reports no urinary symptoms. Of significant note patient has previously had a hysterectomy.    Hx: The following portions of the patient's history were reviewed and updated as appropriate:             She  has a past medical history of Asthma, Collagen vascular disease (HCC), Cystocele, Enterocele, Gallstones (11/2018), Muscle inflammation, Rectocele, Rheumatoid arthritis (HCC), and Vaginal atrophy. She does not have any pertinent problems on file. She  has a past surgical history that includes Total hip arthroplasty (Bilateral, [id number]); hand surgery (2002); Foot surgery (Bilateral, 1993); knee replacement (Bilateral, 1993); neck fusion (2000); Thyroid surgery (1999); Shoulder arthroscopy (2001); Anterior and posterior vaginal repair (2009); Abdominal hysterectomy (1980); Carpal tunnel release (Left); Wrist surgery (Left, 1986); Temporomandibular joint arthroplasty (1991); Elbow surgery; and Hip Closed Reduction (Right, 05/02/2019). Her family history includes Breast cancer in her cousin and maternal aunt; Breast cancer (age of onset: 53) in her paternal aunt; Breast cancer (age of onset: 61) in her maternal aunt; Colon cancer in her cousin; Diabetes in her brother, mother, and paternal aunt. She  reports that she has never smoked. She has never used smokeless tobacco. She reports that she does not drink alcohol and does not use drugs. She has a current medication list which includes the following prescription(s): apixaban starter pack (10mg  and 5mg ), calcium-vitamin d, cyanocobalamin, estradiol,  etanercept, etodolac, ferrous sulfate, hydrochlorothiazide, methotrexate, multi-vitamins, multiple vitamins-minerals, fish oil, omeprazole, prednisone, promethazine, synthroid, tramadol, and vitamin e. She is allergic to b12-c-fa-fe [iron-vit c-vit b12-folic acid], sulfa antibiotics, vitamin b12, alendronate sodium, demerol [meperidine], diclofenac, humira [adalimumab], hydrocodone-acetaminophen, ibandronic acid, morphine and related, nalfon [fenoprofen calcium], remicade [infliximab], sulfasalazine, imuran [azathioprine], oxaprozin, and oxcarbazepine.       Review of Systems:  Review of Systems  Constitutional: Denied constitutional symptoms, night sweats, recent illness, fatigue, fever, insomnia and weight loss.  Eyes: Denied eye symptoms, eye pain, photophobia, vision change and visual disturbance.  Ears/Nose/Throat/Neck: Denied ear, nose, throat or neck symptoms, hearing loss, nasal discharge, sinus congestion and sore throat.  Cardiovascular: Denied cardiovascular symptoms, arrhythmia, chest pain/pressure, edema, exercise intolerance, orthopnea and palpitations.  Respiratory: Denied pulmonary symptoms, asthma, pleuritic pain, productive sputum, cough, dyspnea and wheezing.  Gastrointestinal: Denied, gastro-esophageal reflux, melena, nausea and vomiting.  Genitourinary: See HPI for additional information.  Musculoskeletal: Denied musculoskeletal symptoms, stiffness, swelling, muscle weakness and myalgia.  Dermatologic: Denied dermatology symptoms, rash and scar.  Neurologic: Denied neurology symptoms, dizziness, headache, neck pain and syncope.  Psychiatric: Denied psychiatric symptoms, anxiety and depression.  Endocrine: Denied endocrine symptoms including hot flashes and night sweats.   Meds:   Current Outpatient Medications on File Prior to Visit  Medication Sig Dispense Refill   APIXABAN (ELIQUIS) VTE STARTER PACK (10MG  AND 5MG ) Take as directed on package: start with two-5mg  tablets  twice daily for 7 days. On day 8, switch to one-5mg  tablet twice daily. 1 each 0   Calcium-Vitamin D (CALTRATE 600 PLUS-VIT D PO) Take 1 tablet by mouth daily.      Cyanocobalamin  1000 MCG TBCR Take 1 tablet by mouth daily.      etanercept (ENBREL) 25 MG injection Inject 25 mg into the skin once a week.      etodolac (LODINE) 500 MG tablet Take 500 mg by mouth 2 (two) times daily.      ferrous sulfate 325 (65 FE) MG tablet Take 325 mg by mouth daily with breakfast.     hydrochlorothiazide (HYDRODIURIL) 25 MG tablet Take 25 mg by mouth daily.     methotrexate (RHEUMATREX) 2.5 MG tablet Take 10 mg by mouth once a week.     Multiple Vitamin (MULTI-VITAMINS) TABS Take 1 tablet by mouth daily.      Multiple Vitamins-Minerals (PRESERVISION AREDS 2 PO) Take 1 tablet by mouth daily.      Omega-3 Fatty Acids (FISH OIL) 1000 MG CAPS Take 1 capsule by mouth daily.     omeprazole (PRILOSEC) 20 MG capsule Take 20 mg by mouth daily.      predniSONE (DELTASONE) 5 MG tablet Take 2.5 mg by mouth daily.      promethazine (PHENERGAN) 25 MG tablet Take 12.5-25 mg by mouth every 6 (six) hours as needed for nausea or vomiting.      SYNTHROID 100 MCG tablet Take 100 mcg by mouth daily.     traMADol (ULTRAM) 50 MG tablet Take 50 mg by mouth 2 (two) times daily as needed (pain).      vitamin E 400 UNIT capsule Take 400 Units by mouth daily.      No current facility-administered medications on file prior to visit.      Objective:     Vitals:   09/03/21 1017  BP: 135/83  Pulse: 73  Resp: 16   Filed Weights   09/03/21 1017  Weight: 115 lb 3.2 oz (52.3 kg)              Physical examination   Pelvic:   Vulva: Normal appearance.  No lesions.  Vagina: No lesions or abnormalities noted.  Moderate vaginal atrophy noted   Support: Normal pelvic support.  Urethra No masses tenderness or scarring.  Meatus Normal size without lesions or prolapse.  Cervix: Normal appearance.  No lesions.  Anus: Normal exam.   No lesions.  Perineum: Normal exam.  No lesions.             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 Postmenopausal bleeding   2. Atrophic vaginitis     Small amount of spotting bleeding likely secondary to atrophic vaginitis.   Plan:            1.  Treat with intermittent vaginal estrogen cream as directed.  Expect resolution.  Patient to contact us if bleeding continues. Orders No orders of the defined types were placed in this encounter.    Meds ordered this encounter  Medications   estradiol (ESTRACE) 0.1 MG/GM vaginal cream    Sig: Place 0.25 Applicatorfuls vaginally at bedtime.    Dispense:  90 g    Refill:  3      F/U  No follow-ups on file. I spent 21 minutes involved in the care of this patient preparing to see the patient by obtaining and reviewing her medical history (including labs, imaging tests and prior procedures), documenting clinical information in the electronic health record (EHR), counseling and coordinating care plans, writing and sending prescriptions,  ordering tests or procedures and in direct communicating with the patient and medical staff discussing pertinent items from her history and physical exam.  Elonda Husky, M.D. 09/03/2021 10:40 AM

## 2021-09-21 ENCOUNTER — Other Ambulatory Visit: Payer: Self-pay | Admitting: Orthopedic Surgery

## 2021-09-21 DIAGNOSIS — M25561 Pain in right knee: Secondary | ICD-10-CM

## 2021-09-28 ENCOUNTER — Encounter
Admission: RE | Admit: 2021-09-28 | Discharge: 2021-09-28 | Disposition: A | Discharge status: Home / Self Care | Payer: Medicare Other | Source: Ambulatory Visit | Attending: Orthopedic Surgery | Admitting: Orthopedic Surgery

## 2021-09-28 ENCOUNTER — Other Ambulatory Visit: Payer: Self-pay

## 2021-09-28 DIAGNOSIS — M25561 Pain in right knee: Secondary | ICD-10-CM | POA: Diagnosis not present

## 2021-09-28 MED ORDER — TECHNETIUM TC 99M MEDRONATE IV KIT
20.0000 | PACK | Freq: Once | INTRAVENOUS | Status: AC | PRN
  Administered 2021-09-28: 22.49 via INTRAVENOUS

## 2021-10-30 ENCOUNTER — Ambulatory Visit
Admission: RE | Admit: 2021-10-30 | Discharge: 2021-10-30 | Disposition: A | Discharge status: Home / Self Care | Payer: Medicare Other | Attending: Gastroenterology | Admitting: Gastroenterology

## 2021-10-30 ENCOUNTER — Encounter
Admission: RE | Disposition: A | Discharge status: Home / Self Care | Payer: Self-pay | Source: Home / Self Care | Attending: Gastroenterology

## 2021-10-30 ENCOUNTER — Encounter: Payer: Self-pay | Admitting: *Deleted

## 2021-10-30 ENCOUNTER — Ambulatory Visit: Payer: Medicare Other | Admitting: Anesthesiology

## 2021-10-30 DIAGNOSIS — K625 Hemorrhage of anus and rectum: Secondary | ICD-10-CM | POA: Insufficient documentation

## 2021-10-30 DIAGNOSIS — K640 First degree hemorrhoids: Secondary | ICD-10-CM | POA: Diagnosis not present

## 2021-10-30 DIAGNOSIS — K219 Gastro-esophageal reflux disease without esophagitis: Secondary | ICD-10-CM | POA: Diagnosis not present

## 2021-10-30 DIAGNOSIS — J45909 Unspecified asthma, uncomplicated: Secondary | ICD-10-CM | POA: Insufficient documentation

## 2021-10-30 DIAGNOSIS — Z86718 Personal history of other venous thrombosis and embolism: Secondary | ICD-10-CM | POA: Insufficient documentation

## 2021-10-30 DIAGNOSIS — R194 Change in bowel habit: Secondary | ICD-10-CM | POA: Insufficient documentation

## 2021-10-30 DIAGNOSIS — E039 Hypothyroidism, unspecified: Secondary | ICD-10-CM | POA: Insufficient documentation

## 2021-10-30 DIAGNOSIS — M069 Rheumatoid arthritis, unspecified: Secondary | ICD-10-CM | POA: Insufficient documentation

## 2021-10-30 HISTORY — DX: Failed or difficult intubation, initial encounter: T88.4XXA

## 2021-10-30 HISTORY — PX: COLONOSCOPY WITH PROPOFOL: SHX5780

## 2021-10-30 HISTORY — DX: Stress fracture, left femur, subsequent encounter for fracture with malunion: M84.352P

## 2021-10-30 HISTORY — DX: Gastro-esophageal reflux disease without esophagitis: K21.9

## 2021-10-30 SURGERY — COLONOSCOPY WITH PROPOFOL
Anesthesia: General

## 2021-10-30 MED ORDER — DEXMEDETOMIDINE HCL IN NACL 200 MCG/50ML IV SOLN
INTRAVENOUS | Status: AC
  Filled 2021-10-30: qty 50

## 2021-10-30 MED ORDER — LIDOCAINE HCL (CARDIAC) PF 100 MG/5ML IV SOSY
PREFILLED_SYRINGE | INTRAVENOUS | Status: DC | PRN
  Administered 2021-10-30: 50 mg via INTRAVENOUS

## 2021-10-30 MED ORDER — SODIUM CHLORIDE 0.9 % IV SOLN: INTRAVENOUS | Status: DC

## 2021-10-30 MED ORDER — PROPOFOL 500 MG/50ML IV EMUL
INTRAVENOUS | Status: AC
  Filled 2021-10-30: qty 50

## 2021-10-30 MED ORDER — LIDOCAINE HCL (PF) 2 % IJ SOLN
INTRAMUSCULAR | Status: AC
  Filled 2021-10-30: qty 5

## 2021-10-30 MED ORDER — PROPOFOL 10 MG/ML IV BOLUS
INTRAVENOUS | Status: DC | PRN
  Administered 2021-10-30: 50 mg via INTRAVENOUS
  Administered 2021-10-30: 20 mg via INTRAVENOUS

## 2021-10-30 MED ORDER — PROPOFOL 500 MG/50ML IV EMUL
INTRAVENOUS | Status: DC | PRN
  Administered 2021-10-30: 150 ug/kg/min via INTRAVENOUS

## 2021-10-30 MED ORDER — DEXMEDETOMIDINE (PRECEDEX) IN NS 20 MCG/5ML (4 MCG/ML) IV SYRINGE
PREFILLED_SYRINGE | INTRAVENOUS | Status: DC | PRN
  Administered 2021-10-30: 4 ug via INTRAVENOUS

## 2021-10-30 NOTE — Interval H&P Note (Signed)
History and Physical Interval Note:  10/30/2021 9:13 AM  Crystal [name]  has presented today for surgery, with the diagnosis of Bowel Habit Changes Constipation Rectal Bleeding.  The various methods of treatment have been discussed with the patient and family. After consideration of risks, benefits and other options for treatment, the patient has consented to  Procedure(s): COLONOSCOPY WITH PROPOFOL (N/A) as a surgical intervention.  The patient's history has been reviewed, patient examined, no change in status, stable for surgery.  I have reviewed the patient's chart and labs.  Questions were answered to the patient's satisfaction.     Regis Bill  Ok to proceed with colonoscopy

## 2021-10-30 NOTE — Anesthesia Postprocedure Evaluation (Signed)
Anesthesia Post Note  Patient: Naydelin Ziegler  Procedure(s) Performed: COLONOSCOPY WITH PROPOFOL  Patient location during evaluation: PACU Anesthesia Type: General Level of consciousness: awake and alert Pain management: pain level controlled Vital Signs Assessment: post-procedure vital signs reviewed and stable Respiratory status: spontaneous breathing, nonlabored ventilation, respiratory function stable and patient connected to nasal cannula oxygen Cardiovascular status: blood pressure returned to baseline and stable Postop Assessment: no apparent nausea or vomiting Anesthetic complications: no   No notable events documented.   Last Vitals:  Vitals:   10/30/21 0950 10/30/21 1000  BP: 117/73 137/76  Pulse: 78 85  Resp: (!) 25 20  Temp:    SpO2: 98% 100%    Last Pain:  Vitals:   10/30/21 0940  TempSrc: Temporal  PainSc:                  Neldon Mc

## 2021-10-30 NOTE — Anesthesia Procedure Notes (Signed)
Date/Time: 10/30/2021 9:13 AM Performed by: Ginger Carne, CRNA Pre-anesthesia Checklist: Patient identified, Emergency Drugs available, Suction available, Patient being monitored and Timeout performed Patient Re-evaluated:Patient Re-evaluated prior to induction Oxygen Delivery Method: Nasal cannula Preoxygenation: Pre-oxygenation with 100% oxygen Induction Type: IV induction

## 2021-10-30 NOTE — Anesthesia Preprocedure Evaluation (Addendum)
Anesthesia Evaluation    History of Anesthesia Complications (+) history of anesthetic complications (difficult intubation 2009, see notes below)  Airway Mallampati: III  TM Distance: <3 FB Neck ROM: Limited  Mouth opening: Limited Mouth Opening Comment: Chin slightly flexed forward - no extension of neck possible Dental   Pulmonary asthma (controlled) ,    PE 01/2021, on Eliquis x 3 months only (provoked following DVT after a fall)   Pulmonary exam normal        Cardiovascular Normal cardiovascular exam    Collagen vascular disease   Neuro/Psych   Cervical fusion C1/2 negative psych ROS   GI/Hepatic Neg liver ROS, GERD  Controlled,  Endo/Other  Hypothyroidism   Renal/GU negative Renal ROS     Musculoskeletal  (+) Arthritis  (severe RA - on Enbrel, MTX, prednisone ),   Abdominal   Peds  Hematology   DVT 01/2021   Anesthesia Other Findings 2009 - received letter from anesthesia regarding difficult intubation  2020 - closed hip reduction under GA - easy BMV but unable to get video laryngoscope into mouth due to neck instability.  Mask ventilated the rest of the case.  Hx of LMA 3 in 2018 w/o difficulty  EKG 02/22/21 Sinus rhythm Probable anteroseptal infarct, old  Reproductive/Obstetrics                            Anesthesia Physical Anesthesia Plan  ASA: 3  Anesthesia Plan: General   Post-op Pain Management:    Induction:   PONV Risk Score and Plan:   Airway Management Planned: Natural Airway  Additional Equipment:   Intra-op Plan:   Post-operative Plan:   Informed Consent: I have reviewed the patients History and Physical, chart, labs and discussed the procedure including the risks, benefits and alternatives for the proposed anesthesia with the patient or authorized representative who has indicated his/her understanding and acceptance.       Plan Discussed with:  CRNA  Anesthesia Plan Comments: (Note hx of difficulty airway, and current difficult appearing anatomy.  Hx of C1/2 fusion with no ROM of neck - do not attempt to extend neck.  Would need video laryngoscope if intubation required.)        Anesthesia Quick Evaluation

## 2021-10-30 NOTE — Op Note (Signed)
[facility] Gastroenterology Patient Name: Cianna Kasparian Procedure Date: 10/30/2021 9:01 AM MRN: [id number] Account #: [id number] Date of Birth: 1950/11/27 Admit Type: Outpatient Age: 70 Room: [facility] ENDO ROOM 1 Gender: Female Note Status: Finalized Instrument Name: Peds Colonoscope [id number] Procedure:             Colonoscopy Indications:           Rectal bleeding, Change in bowel habits Providers:             Andrey Farmer MD, MD Medicines:             Monitored Anesthesia Care Complications:         No immediate complications. Procedure:             Pre-Anesthesia Assessment:                        - Prior to the procedure, a History and Physical was                         performed, and patient medications and allergies were                         reviewed. The patient is competent. The risks and                         benefits of the procedure and the sedation options and                         risks were discussed with the patient. All questions                         were answered and informed consent was obtained.                         Patient identification and proposed procedure were                         verified by the physician, the nurse, the anesthetist                         and the technician in the endoscopy suite. Mental                         Status Examination: alert and oriented. Airway                         Examination: normal oropharyngeal airway and neck                         mobility. Respiratory Examination: clear to                         auscultation. CV Examination: normal. Prophylactic                         Antibiotics: The patient does not require prophylactic                         antibiotics. Prior Anticoagulants: The patient has  taken no previous anticoagulant or antiplatelet                         agents. ASA Grade Assessment: III - A patient with                         severe  systemic disease. After reviewing the risks and                         benefits, the patient was deemed in satisfactory                         condition to undergo the procedure. The anesthesia                         plan was to use monitored anesthesia care (MAC).                         Immediately prior to administration of medications,                         the patient was re-assessed for adequacy to receive                         sedatives. The heart rate, respiratory rate, oxygen                         saturations, blood pressure, adequacy of pulmonary                         ventilation, and response to care were monitored                         throughout the procedure. The physical status of the                         patient was re-assessed after the procedure.                        After obtaining informed consent, the colonoscope was                         passed under direct vision. Throughout the procedure,                         the patient's blood pressure, pulse, and oxygen                         saturations were monitored continuously. The                         Colonoscope was introduced through the anus and                         advanced to the the cecum, identified by appendiceal                         orifice and ileocecal valve. The colonoscopy was  technically difficult and complex due to a tortuous                         colon. The patient tolerated the procedure well. The                         quality of the bowel preparation was good. Findings:      The perianal and digital rectal examinations were normal.      Internal hemorrhoids were found during retroflexion. The hemorrhoids       were Grade I (internal hemorrhoids that do not prolapse).      The exam was otherwise without abnormality on direct and retroflexion       views. Impression:            - Internal hemorrhoids.                        - The examination was  otherwise normal on direct and                         retroflexion views.                        - No specimens collected. Recommendation:        - Discharge patient to home.                        - Resume previous diet.                        - Continue present medications.                        - Repeat colonoscopy is not recommended due to current                         age (60 years or older) for screening purposes.                        - Return to referring physician as previously                         scheduled. Procedure Code(s):     --- Professional ---                        [phone], Colonoscopy, flexible; diagnostic, including                         collection of specimen(s) by brushing or washing, when                         performed (separate procedure) Diagnosis Code(s):     --- Professional ---                        K64.0, First degree hemorrhoids                        K62.5, Hemorrhage of anus and rectum  R19.4, Change in bowel habit CPT copyright 2019 [employer]. All rights reserved. The codes documented in this report are preliminary and upon coder review may  be revised to meet current compliance requirements. Andrey Farmer MD, MD 10/30/2021 9:43:13 AM Number of Addenda: 0 Note Initiated On: 10/30/2021 9:01 AM Scope Withdrawal Time: 0 hours 6 minutes 17 seconds  Total Procedure Duration: 0 hours 20 minutes 4 seconds  Estimated Blood Loss:  Estimated blood loss: none.      [facility]

## 2021-10-30 NOTE — Transfer of Care (Signed)
Immediate Anesthesia Transfer of Care Note  Patient: Crystal [name]  Procedure(s) Performed: COLONOSCOPY WITH PROPOFOL  Patient Location: Endoscopy Unit  Anesthesia Type:General  Level of Consciousness: drowsy  Airway & Oxygen Therapy: Patient Spontanous Breathing  Post-op Assessment: Report given to RN and Post -op Vital signs reviewed and stable  Post vital signs: Reviewed and stable  Last Vitals:  Vitals Value Taken Time  BP 108/72 10/30/21 0944  Temp 36.1 C 10/30/21 0940  Pulse 80 10/30/21 0946  Resp 17 10/30/21 0946  SpO2 97 % 10/30/21 0946  Vitals shown include unvalidated device data.  Last Pain:  Vitals:   10/30/21 0940  TempSrc: Temporal  PainSc:          Complications: No notable events documented.

## 2021-10-30 NOTE — H&P (Signed)
Outpatient short stay form Pre-procedure 10/30/2021  Regis Bill, MD  Primary Physician: Kandyce Rud, MD  Reason for visit:  Change in bowel habits, Rectal bleeding  History of present illness:   70 y/o lady with history of hypothyroidism, RA, and joint replacement here for colonoscopy for change in bowel habits and rectal bleeding. No blood thinners. History of hysterectomy. Two first cousins with colon cancer. No first degree relatives with colon cancer.    Current Facility-Administered Medications:    0.9 %  sodium chloride infusion, , Intravenous, Continuous, Tayari Yankee, Rossie Muskrat, MD, Last Rate: 20 mL/hr at 10/30/21 0851, New Bag at 10/30/21 0851  Medications Prior to Admission  Medication Sig Dispense Refill Last Dose   Calcium-Vitamin D (CALTRATE 600 PLUS-VIT D PO) Take 1 tablet by mouth daily.    10/29/2021   Cyanocobalamin 1000 MCG TBCR Take 1 tablet by mouth daily.    10/29/2021   estradiol (ESTRACE) 0.1 MG/GM vaginal cream Place 0.25 Applicatorfuls vaginally at bedtime. 90 g 3 Past Week   etodolac (LODINE) 500 MG tablet Take 500 mg by mouth 2 (two) times daily.    10/29/2021   ferrous sulfate 325 (65 FE) MG tablet Take 325 mg by mouth daily with breakfast.   Past Week   hydrochlorothiazide (HYDRODIURIL) 25 MG tablet Take 25 mg by mouth daily.   10/29/2021   Multiple Vitamins-Minerals (PRESERVISION AREDS 2 PO) Take 1 tablet by mouth daily.    Past Week   Omega-3 Fatty Acids (FISH OIL) 1000 MG CAPS Take 1 capsule by mouth daily.   Past Week   omeprazole (PRILOSEC) 20 MG capsule Take 20 mg by mouth daily.    10/29/2021   predniSONE (DELTASONE) 5 MG tablet Take 2.5 mg by mouth daily.    10/29/2021   SYNTHROID 100 MCG tablet Take 100 mcg by mouth daily.   10/29/2021   traMADol (ULTRAM) 50 MG tablet Take 50 mg by mouth 2 (two) times daily as needed (pain).    10/29/2021   vitamin E 400 UNIT capsule Take 400 Units by mouth daily.    Past Week   APIXABAN (ELIQUIS) VTE STARTER PACK  (10MG  AND 5MG ) Take as directed on package: start with two-5mg  tablets twice daily for 7 days. On day 8, switch to one-5mg  tablet twice daily. 1 each 0    etanercept (ENBREL) 25 MG injection Inject 25 mg into the skin once a week.    10/23/2021   methotrexate (RHEUMATREX) 2.5 MG tablet Take 10 mg by mouth once a week.   10/24/2021   Multiple Vitamin (MULTI-VITAMINS) TABS Take 1 tablet by mouth daily.       promethazine (PHENERGAN) 25 MG tablet Take 12.5-25 mg by mouth every 6 (six) hours as needed for nausea or vomiting.         Allergies  Allergen Reactions   B12-C-Fa-Fe [Iron-Vit C-Vit B12-Folic Acid] Shortness Of Breath   Sulfa Antibiotics Other (See Comments)    Caused skin to come off in her mouth and down her throat.     Vitamin B12 Anaphylaxis    When she takes a *HIGH DOSE*   Alendronate Sodium     Other reaction(s): Other (See Comments) RECTAL BLEEDING   Demerol [Meperidine] Nausea Only   Diclofenac     Other reaction(s): Other (See Comments) MOUTH ULCERS   Humira [Adalimumab] Swelling    brain   Hydrocodone-Acetaminophen Nausea Only   Ibandronic Acid Other (See Comments)    CONSTIPATION Other reaction(s): Other (See  Comments) CONSTIPATION   Morphine And Related Nausea And Vomiting   Nalfon [Fenoprofen Calcium] Hives   Remicade [Infliximab]     Drug induce lupus   Sulfasalazine Other (See Comments)    Ulcer    Imuran [Azathioprine] Rash   Oxaprozin Rash   Oxcarbazepine Rash    Skin peeling, like a sunburn     Past Medical History:  Diagnosis Date   Asthma    Collagen vascular disease (HCC)    Cystocele    2nd degree   Difficult intubation    DVT (deep venous thrombosis) (HCC) 01/2021   Enterocele    early   Gallstones 11/2018   GERD (gastroesophageal reflux disease)    Muscle inflammation    Pulmonary embolism (HCC) 01/2021   Rectocele    mild   Rheumatoid arthritis (HCC)    Stress fracture of other site of femur due to multiple or repetitive  stress, left, with malunion, subsequent encounter    Vaginal atrophy     Review of systems:  Otherwise negative.    Physical Exam  Gen: Alert, oriented. Appears stated age.  HEENT:  PERRLA. Lungs: No respiratory distress CV: RRR Abd: soft, benign, no masses Ext: No edema    Planned procedures: Proceed with colonoscopy. The patient understands the nature of the planned procedure, indications, risks, alternatives and potential complications including but not limited to bleeding, infection, perforation, damage to internal organs and possible oversedation/side effects from anesthesia. The patient agrees and gives consent to proceed.  Please refer to procedure notes for findings, recommendations and patient disposition/instructions.     Regis Bill, MD [facility] Gastroenterology

## 2021-10-31 ENCOUNTER — Encounter: Payer: Self-pay | Admitting: Gastroenterology

## 2022-03-06 ENCOUNTER — Telehealth: Payer: Self-pay | Admitting: Obstetrics and Gynecology

## 2022-03-06 NOTE — Telephone Encounter (Signed)
Pt states "Crystal [name] was billed for an annual physical incorrectly". Pt states "Crystal [name] saw Dr Logan Bores and Medicare has sent her a bill". When Crystal [name] called billing they asked her to  address the issue with the provider.  Please return her call ?

## 2022-03-27 ENCOUNTER — Telehealth: Payer: Self-pay | Admitting: Obstetrics and Gynecology

## 2022-03-27 NOTE — Telephone Encounter (Signed)
Patient called and would like a call back to help with a problem she has with a bill. Pt was seen on 08/01/2021 and received a bill of $175.00. Patient has paid the bill but Patient states that she has spoken with the billing department with [facility] who advised her to contact medicare. Pt states medicare told her that the appt was coded incorrectly and was told to call our office and have it coded correctly and resubmitted because it is covered by her insurance. Pt states she paid the bill in full April 11,2023 by phone. Please advise.  ?

## 2022-03-28 NOTE — Telephone Encounter (Signed)
Spoke with pt- she said thank you for our time.  ?

## 2022-06-24 ENCOUNTER — Other Ambulatory Visit: Payer: Self-pay | Admitting: Family Medicine

## 2022-06-24 DIAGNOSIS — Z1231 Encounter for screening mammogram for malignant neoplasm of breast: Secondary | ICD-10-CM

## 2022-06-27 ENCOUNTER — Telehealth: Payer: Self-pay | Admitting: Obstetrics and Gynecology

## 2022-06-27 ENCOUNTER — Encounter: Payer: Self-pay | Admitting: Obstetrics and Gynecology

## 2022-06-27 NOTE — Telephone Encounter (Signed)
Lm for pt and sent letter making pt aware of appointment change- and recall added.

## 2022-07-24 ENCOUNTER — Ambulatory Visit
Admission: RE | Admit: 2022-07-24 | Discharge: 2022-07-24 | Disposition: A | Discharge status: Home / Self Care | Payer: Medicare Other | Source: Ambulatory Visit | Attending: Family Medicine | Admitting: Family Medicine

## 2022-07-24 DIAGNOSIS — Z1231 Encounter for screening mammogram for malignant neoplasm of breast: Secondary | ICD-10-CM | POA: Insufficient documentation

## 2022-07-31 ENCOUNTER — Encounter: Payer: Self-pay | Admitting: Obstetrics and Gynecology

## 2022-07-31 ENCOUNTER — Ambulatory Visit: Payer: Medicare Other | Admitting: Obstetrics and Gynecology

## 2022-07-31 VITALS — BP 143/80 | HR 77 | Ht 61.0 in | Wt 111.1 lb

## 2022-07-31 DIAGNOSIS — L900 Lichen sclerosus et atrophicus: Secondary | ICD-10-CM | POA: Diagnosis not present

## 2022-07-31 DIAGNOSIS — N8111 Cystocele, midline: Secondary | ICD-10-CM | POA: Diagnosis not present

## 2022-07-31 MED ORDER — CLOBETASOL PROPIONATE 0.05 % EX OINT: 1.0000 | TOPICAL_OINTMENT | CUTANEOUS | 0 refills | Status: AC

## 2022-07-31 NOTE — Progress Notes (Signed)
HPI:      [name] is a 71 y.o. [phone] who LMP was No LMP recorded. Patient has had a hysterectomy.  Subjective:   She presents today because she has not been seen in the year and she has had a new issue that has come up.  Approximately a month ago she noticed a bump on her upper left labia that was somewhat painful.  She used warm compresses on it and it drained.  Since that time it has improved and continues to improve.  She reports that she has also noticed some itching of her labia that is new.  She denies vaginal discharge. She has a history of a third-degree cystocele but reports that she is not having any problems with it and she has no/little urinary incontinence. She has had a hysterectomy    Hx: The following portions of the patient's history were reviewed and updated as appropriate:             She  has a past medical history of Asthma, Collagen vascular disease (HCC), Cystocele, Difficult intubation, DVT (deep venous thrombosis) (HCC) (01/2021), Enterocele, Gallstones (11/2018), GERD (gastroesophageal reflux disease), Muscle inflammation, Pulmonary embolism (HCC) (01/2021), Rectocele, Rheumatoid arthritis (HCC), Stress fracture of other site of femur due to multiple or repetitive stress, left, with malunion, subsequent encounter, and Vaginal atrophy. She does not have any pertinent problems on file. She  has a past surgical history that includes Total hip arthroplasty (Bilateral, [id number]); hand surgery (2002); Foot surgery (Bilateral, 1993); knee replacement (Bilateral, 1993); neck fusion (2000); Thyroid surgery (1999); Shoulder arthroscopy (2001); Anterior and posterior vaginal repair (2009); Abdominal hysterectomy (1980); Carpal tunnel release (Left); Wrist surgery (Left, 1986); Temporomandibular joint arthroplasty (1991); Elbow surgery; Hip Closed Reduction (Right, 05/02/2019); Colonoscopy; and Colonoscopy with propofol (N/A, 10/30/2021). Her family history includes  Breast cancer in her cousin and maternal aunt; Breast cancer (age of onset: 71) in her paternal aunt; Breast cancer (age of onset: 75) in her maternal aunt; Colon cancer in her cousin; Diabetes in her brother, mother, and paternal aunt. She  reports that she has never smoked. She has never used smokeless tobacco. She reports that she does not drink alcohol and does not use drugs. She has a current medication list which includes the following prescription(s): calcium-vitamin d, cyanocobalamin, etanercept, etodolac, ferrous sulfate, hydrochlorothiazide, methotrexate, multi-vitamins, multiple vitamins-minerals, omeprazole, prednisone, promethazine, synthroid, tramadol, vitamin e, and estradiol. She is allergic to b12-c-fa-fe [iron-vit c-vit b12-folic acid], sulfa antibiotics, vitamin b12, alendronate sodium, demerol [meperidine], diclofenac, humira [adalimumab], hydrocodone-acetaminophen, ibandronic acid, morphine and related, nalfon [fenoprofen calcium], remicade [infliximab], sulfasalazine, imuran [azathioprine], oxaprozin, and oxcarbazepine.       Review of Systems:  Review of Systems  Constitutional: Denied constitutional symptoms, night sweats, recent illness, fatigue, fever, insomnia and weight loss.  Eyes: Denied eye symptoms, eye pain, photophobia, vision change and visual disturbance.  Ears/Nose/Throat/Neck: Denied ear, nose, throat or neck symptoms, hearing loss, nasal discharge, sinus congestion and sore throat.  Cardiovascular: Denied cardiovascular symptoms, arrhythmia, chest pain/pressure, edema, exercise intolerance, orthopnea and palpitations.  Respiratory: Denied pulmonary symptoms, asthma, pleuritic pain, productive sputum, cough, dyspnea and wheezing.  Gastrointestinal: Denied, gastro-esophageal reflux, melena, nausea and vomiting.  Genitourinary: See HPI for additional information.  Musculoskeletal: Denied musculoskeletal symptoms, stiffness, swelling, muscle weakness and myalgia.   Dermatologic: Denied dermatology symptoms, rash and scar.  Neurologic: Denied neurology symptoms, dizziness, headache, neck pain and syncope.  Psychiatric: Denied psychiatric symptoms, anxiety and depression.  Endocrine: Denied endocrine symptoms including  hot flashes and night sweats.   Meds:   Current Outpatient Medications on File Prior to Visit  Medication Sig Dispense Refill   Calcium-Vitamin D (CALTRATE 600 PLUS-VIT D PO) Take 1 tablet by mouth daily.      Cyanocobalamin 1000 MCG TBCR Take 1 tablet by mouth daily.      etanercept (ENBREL) 25 MG injection Inject 25 mg into the skin once a week.      etodolac (LODINE) 500 MG tablet Take 500 mg by mouth 2 (two) times daily.      ferrous sulfate 325 (65 FE) MG tablet Take 325 mg by mouth daily with breakfast.     hydrochlorothiazide (HYDRODIURIL) 25 MG tablet Take 25 mg by mouth daily.     methotrexate (RHEUMATREX) 2.5 MG tablet Take 10 mg by mouth once a week.     Multiple Vitamin (MULTI-VITAMINS) TABS Take 1 tablet by mouth daily.      Multiple Vitamins-Minerals (PRESERVISION AREDS 2 PO) Take 1 tablet by mouth daily.      omeprazole (PRILOSEC) 20 MG capsule Take 20 mg by mouth daily.      predniSONE (DELTASONE) 5 MG tablet Take 2.5 mg by mouth daily.      promethazine (PHENERGAN) 25 MG tablet Take 12.5-25 mg by mouth every 6 (six) hours as needed for nausea or vomiting.      SYNTHROID 100 MCG tablet Take 100 mcg by mouth daily.     traMADol (ULTRAM) 50 MG tablet Take 50 mg by mouth 2 (two) times daily as needed (pain).      vitamin E 400 UNIT capsule Take 400 Units by mouth daily.      estradiol (ESTRACE) 0.1 MG/GM vaginal cream Place [id number] Applicatorfuls vaginally at bedtime. (Patient not taking: Reported on 07/31/2022) 90 g 3   No current facility-administered medications on file prior to visit.      Objective:     Vitals:   07/31/22 1110  BP: (!) 143/80  Pulse: 77   Filed Weights   07/31/22 1110  Weight: 111 lb 1.6 oz  (50.4 kg)              Physical examination   Pelvic:   Vulva: Normal appearance.  No lesions.  Area of concern noted.  No lumps or bumps seen at this time.  There appears to be some whitening of the skin noted in that area.  Vagina: No lesions or abnormalities noted.  Support: Third-degree cystocele  Urethra No masses tenderness or scarring.  Meatus Normal size without lesions or prolapse.  Cervix: Normal appearance.  No lesions.  Anus: Normal exam.  No lesions.  Perineum: Normal exam.  No lesions.        Bimanual   Uterus: Normal size.  Non-tender.  Mobile.  AV.  Adnexae: No masses.  Non-tender to palpation.  Cul-de-sac: Negative for abnormality.             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 Midline cystocele   2. Lichen sclerosus     Patient with some vulvar itching-possible early lichen sclerosis.   Plan:            1.  We will treat with clobetasol cream for 1 month and see if she notes significant improvement  2.  Nothing further to do regarding cystocele as  patient is completely asymptomatic. Orders No orders of the defined types were placed in this encounter.   No orders of the defined types were placed in this encounter.     F/U  No follow-ups on file. I spent 18 minutes involved in the care of this patient preparing to see the patient by obtaining and reviewing her medical history (including labs, imaging tests and prior procedures), documenting clinical information in the electronic health record (EHR), counseling and coordinating care plans, writing and sending prescriptions, ordering tests or procedures and in direct communicating with the patient and medical staff discussing pertinent items from her history and physical exam.  Elonda Husky, M.D. 07/31/2022 11:44 AM

## 2022-07-31 NOTE — Progress Notes (Signed)
Patient presents today to discuss vaginal bump and ongoing cystocele. She states a vaginal bump appeared approximately 1 month ago and vas very painful. She states using warm compresses and that the bump has reduced in size. Patient has history of cystocele, denies discomfort or incontinence. No other concerns at this time.

## 2022-08-02 ENCOUNTER — Encounter: Payer: Medicare Other | Admitting: Obstetrics and Gynecology

## 2023-06-17 ENCOUNTER — Other Ambulatory Visit: Payer: Self-pay | Admitting: Family Medicine

## 2023-06-17 DIAGNOSIS — Z1231 Encounter for screening mammogram for malignant neoplasm of breast: Secondary | ICD-10-CM

## 2023-07-29 ENCOUNTER — Encounter: Payer: Self-pay | Admitting: Obstetrics and Gynecology

## 2023-07-29 ENCOUNTER — Ambulatory Visit: Payer: Medicare Other | Admitting: Obstetrics and Gynecology

## 2023-07-29 VITALS — BP 134/73 | HR 87 | Ht 61.0 in | Wt 113.6 lb

## 2023-07-29 DIAGNOSIS — L900 Lichen sclerosus et atrophicus: Secondary | ICD-10-CM | POA: Diagnosis not present

## 2023-07-29 DIAGNOSIS — N8111 Cystocele, midline: Secondary | ICD-10-CM | POA: Diagnosis not present

## 2023-07-29 NOTE — Progress Notes (Signed)
Patient presents today for a cystocele and lichen sclerous follow-up. She states no pain or trouble with any incontinence. Patient states she does not have any vaginal itching therefor she does not use her clobetasol cream.

## 2023-07-29 NOTE — Progress Notes (Signed)
HPI:      [name] is a 72 y.o. [phone] who LMP was No LMP recorded. Patient has had a hysterectomy.  Subjective:   She presents today for GYN exam.  She has a known cystocele and had significant vaginal itching last year.  She was started on clobetasol for a trial.  She reports that she is asymptomatic with her cystocele and feels no different this year than last year.  She does state that her itching has resolved and she is no longer using any clobetasol.    Hx: The following portions of the patient's history were reviewed and updated as appropriate:             She  has a past medical history of Asthma, Collagen vascular disease (HCC), Cystocele, Difficult intubation, DVT (deep venous thrombosis) (HCC) (01/2021), Enterocele, Gallstones (11/2018), GERD (gastroesophageal reflux disease), Muscle inflammation, Pulmonary embolism (HCC) (01/2021), Rectocele, Rheumatoid arthritis (HCC), Stress fracture of other site of femur due to multiple or repetitive stress, left, with malunion, subsequent encounter, and Vaginal atrophy. She does not have any pertinent problems on file. She  has a past surgical history that includes Total hip arthroplasty (Bilateral, [id number]); hand surgery (2002); Foot surgery (Bilateral, 1993); knee replacement (Bilateral, 1993); neck fusion (2000); Thyroid surgery (1999); Shoulder arthroscopy (2001); Anterior and posterior vaginal repair (2009); Abdominal hysterectomy (1980); Carpal tunnel release (Left); Wrist surgery (Left, 1986); Temporomandibular joint arthroplasty (1991); Elbow surgery; Hip Closed Reduction (Right, 05/02/2019); Colonoscopy; and Colonoscopy with propofol (N/A, 10/30/2021). Her family history includes Breast cancer in her cousin and maternal aunt; Breast cancer (age of onset: 40) in her paternal aunt; Breast cancer (age of onset: 1) in her maternal aunt; Colon cancer in her cousin; Diabetes in her brother, mother, and paternal aunt. She   reports that she has never smoked. She has never used smokeless tobacco. She reports that she does not drink alcohol and does not use drugs. She has a current medication list which includes the following prescription(s): calcium-vitamin d, clobetasol ointment, cyanocobalamin, etanercept, etodolac, ferrous sulfate, hydrochlorothiazide, methotrexate, multi-vitamins, multiple vitamins-minerals, omeprazole, prednisone, promethazine, synthroid, tramadol, and vitamin e. She is allergic to b12-c-fa-fe [iron-vit c-vit b12-folic acid], sulfa antibiotics, vitamin b12, alendronate sodium, demerol [meperidine], diclofenac, humira [adalimumab], hydrocodone-acetaminophen, ibandronic acid, morphine and codeine, nalfon [fenoprofen calcium], remicade [infliximab], sulfasalazine, imuran [azathioprine], oxaprozin, and oxcarbazepine.       Review of Systems:  Review of Systems  Constitutional: Denied constitutional symptoms, night sweats, recent illness, fatigue, fever, insomnia and weight loss.  Eyes: Denied eye symptoms, eye pain, photophobia, vision change and visual disturbance.  Ears/Nose/Throat/Neck: Denied ear, nose, throat or neck symptoms, hearing loss, nasal discharge, sinus congestion and sore throat.  Cardiovascular: Denied cardiovascular symptoms, arrhythmia, chest pain/pressure, edema, exercise intolerance, orthopnea and palpitations.  Respiratory: Denied pulmonary symptoms, asthma, pleuritic pain, productive sputum, cough, dyspnea and wheezing.  Gastrointestinal: Denied, gastro-esophageal reflux, melena, nausea and vomiting.  Genitourinary: Denied genitourinary symptoms including symptomatic vaginal discharge, pelvic relaxation issues, and urinary complaints.  Musculoskeletal: Denied musculoskeletal symptoms, stiffness, swelling, muscle weakness and myalgia.  Dermatologic: Denied dermatology symptoms, rash and scar.  Neurologic: Denied neurology symptoms, dizziness, headache, neck pain and syncope.   Psychiatric: Denied psychiatric symptoms, anxiety and depression.  Endocrine: Denied endocrine symptoms including hot flashes and night sweats.   Meds:   Current Outpatient Medications on File Prior to Visit  Medication Sig Dispense Refill   Calcium-Vitamin D (CALTRATE 600 PLUS-VIT D PO) Take 1 tablet by mouth daily.  clobetasol ointment (TEMOVATE) 0.05 % Apply 1 Application topically every other day. 45 g 0   Cyanocobalamin 1000 MCG TBCR Take 1 tablet by mouth daily.      etanercept (ENBREL) 25 MG injection Inject 25 mg into the skin once a week.      etodolac (LODINE) 500 MG tablet Take 500 mg by mouth 2 (two) times daily.      ferrous sulfate 325 (65 FE) MG tablet Take 325 mg by mouth daily with breakfast.     hydrochlorothiazide (HYDRODIURIL) 25 MG tablet Take 25 mg by mouth daily.     methotrexate (RHEUMATREX) 2.5 MG tablet Take 10 mg by mouth once a week.     Multiple Vitamin (MULTI-VITAMINS) TABS Take 1 tablet by mouth daily.      Multiple Vitamins-Minerals (PRESERVISION AREDS 2 PO) Take 1 tablet by mouth daily.      omeprazole (PRILOSEC) 20 MG capsule Take 20 mg by mouth daily.      predniSONE (DELTASONE) 5 MG tablet Take 2.5 mg by mouth daily.      promethazine (PHENERGAN) 25 MG tablet Take 12.5-25 mg by mouth every 6 (six) hours as needed for nausea or vomiting.      SYNTHROID 100 MCG tablet Take 100 mcg by mouth daily.     traMADol (ULTRAM) 50 MG tablet Take 50 mg by mouth 2 (two) times daily as needed (pain).      vitamin E 400 UNIT capsule Take 400 Units by mouth daily.      No current facility-administered medications on file prior to visit.      Objective:     Vitals:   07/29/23 1013 07/29/23 1109  BP: (!) 147/87 134/73  Pulse: 87    Filed Weights   07/29/23 1013  Weight: 113 lb 9.6 oz (51.5 kg)              Physical examination   Pelvic:   Vulva: Normal appearance.  No lesions.  Vagina: No lesions or abnormalities noted.  atrophic  Support: Second  to third-degree cystocele present  Urethra No masses tenderness or scarring.  Meatus Normal size without lesions or prolapse.  Cervix: Normal appearance.  No lesions.  Anus: Normal exam.  No lesions.  Perineum: Normal exam.  No lesions.        Bimanual   Uterus: Normal size.  Non-tender.  Mobile.  AV.  Adnexae: No masses.  Non-tender to palpation.  Cul-de-sac: Negative for abnormality.             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 Midline cystocele   2.  Lichen sclerosus     Asymptomatic with cystocele-no issues with vaginal itching and not using clobetasol for lichen sclerosus.   Plan:            1.  Expectant management for cystocele and lichen sclerosus.  Patient to follow-up in 1 year. Orders No orders of the defined types were placed in this encounter.   No orders of the defined types were placed in this encounter.     F/U  No follow-ups on file.  Elonda Husky, M.D. 07/29/2023 11:12 AM

## 2023-07-30 ENCOUNTER — Ambulatory Visit
Admission: RE | Admit: 2023-07-30 | Discharge: 2023-07-30 | Disposition: A | Discharge status: Home / Self Care | Payer: Medicare Other | Source: Ambulatory Visit | Attending: Family Medicine | Admitting: Family Medicine

## 2023-07-30 DIAGNOSIS — Z1231 Encounter for screening mammogram for malignant neoplasm of breast: Secondary | ICD-10-CM | POA: Insufficient documentation

## 2023-08-29 NOTE — Addendum Note (Signed)
Addended by: Elonda Husky on: 08/29/2023 06:56 PM   Modules accepted: Level of Service

## 2024-02-10 ENCOUNTER — Ambulatory Visit: Attending: Unknown Physician Specialty | Admitting: Speech Pathology

## 2024-02-10 DIAGNOSIS — R490 Dysphonia: Secondary | ICD-10-CM | POA: Insufficient documentation

## 2024-02-10 NOTE — Therapy (Unsigned)
 OUTPATIENT SPEECH LANGUAGE PATHOLOGY  VOICE EVALUATION   Patient Name: Crystal [name] MRN: [id number] DOB:Apr 30, 1951, 73 y.o., female Today's Date: 02/11/2024  PCP: Kandyce Rud, MD REFERRING PROVIDER: Linus Salmons, MD   End of Session - 02/11/24 1342     Visit Number 1    Number of Visits 17    Date for SLP Re-Evaluation 04/06/24    Authorization Type [employer]    Progress Note Due on Visit 10    SLP Start Time 1145    SLP Stop Time  1230    SLP Time Calculation (min) 45 min    Activity Tolerance Patient tolerated treatment well             Past Medical History:  Diagnosis Date   Asthma    Collagen vascular disease (HCC)    Cystocele    2nd degree   Difficult intubation    DVT (deep venous thrombosis) (HCC) 01/2021   Enterocele    early   Gallstones 11/2018   GERD (gastroesophageal reflux disease)    Muscle inflammation    Pulmonary embolism (HCC) 01/2021   Rectocele    mild   Rheumatoid arthritis (HCC)    Stress fracture of other site of femur due to multiple or repetitive stress, left, with malunion, subsequent encounter    Vaginal atrophy    Past Surgical History:  Procedure Laterality Date   ABDOMINAL HYSTERECTOMY  1980   ANTERIOR AND POSTERIOR VAGINAL REPAIR  2009   enterocele ligation   CARPAL TUNNEL RELEASE Left    1982   COLONOSCOPY     x3   COLONOSCOPY WITH PROPOFOL N/A 10/30/2021   Procedure: COLONOSCOPY WITH PROPOFOL;  Surgeon: Regis Bill, MD;  Location: [facility];  Service: Endoscopy;  Laterality: N/A;   ELBOW SURGERY     FOOT SURGERY Bilateral 1993   hand surgery  2002   7 joint implants   HIP CLOSED REDUCTION Right 05/02/2019   Procedure: CLOSED REDUCTION HIP;  Surgeon: Ranee Gosselin, MD;  Location: [facility] OR;  Service: Orthopedics;  Laterality: Right;   knee replacement Bilateral 1993   neck fusion  2000   SHOULDER ARTHROSCOPY  2001   TEMPOROMANDIBULAR JOINT ARTHROPLASTY  1991   THYROID SURGERY   1999   TOTAL HIP ARTHROPLASTY Bilateral [id number]   WRIST SURGERY Left 1986   Patient Active Problem List   Diagnosis Date Noted   Acute pulmonary embolism (HCC) 02/22/2021   Acute deep vein thrombosis (DVT) of left lower extremity (HCC) 02/22/2021   GERD (gastroesophageal reflux disease) 02/22/2021   Hip dislocation, right, initial encounter (HCC) 05/02/2019   Family history of breast cancer 07/23/2018   Airway hyperreactivity 07/12/2015   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   B12 deficiency 07/12/2015   Borderline blood pressure 07/12/2015   Midline cystocele 07/12/2015   Osteopenia 07/12/2015   Arthritis or polyarthritis, rheumatoid (HCC) 07/12/2015   Near syncope 07/12/2015   Thyroid nodule 07/12/2015   History of melena 09/20/2014   Pain in rectum 09/20/2014   Adult hypothyroidism 08/12/2014   History of implantation of joint prosthesis of elbow 05/11/2014   Ankle pain 03/16/2012   Rheumatoid arthritis of foot (HCC) 03/16/2012   History of artificial joint 03/13/2012    ONSET DATE:  > 10 weeks prior; date of referral 02/04/2024  REFERRING DIAG: R49.0 (ICD-10-CM) - Dysphonia   THERAPY DIAG:  Dysphonia  Rationale for Evaluation and Treatment Rehabilitation  SUBJECTIVE:   SUBJECTIVE STATEMENT: Pt pleasant, good historian Pt accompanied  by:  by paid caregiver who remained in the lobby  PERTINENT HISTORY: Pt is a 73 year old female with past medical history of VC cyst with ST services (unable to find in EPIC), rheumatoid arthritis, severe cervical arthritis.   DIAGNOSTIC FINDINGS:  Laryngoscopy 02/03/2024 Mild VC bowing, using false cords to speak, no cyst seen, no tumor or mas, excellent mobility   PAIN:  Are you having pain? No   FALLS: Has patient fallen in last 6 months? No,  LIVING ENVIRONMENT: Lives with: lives alone and has a paid caregiver 2 mornings a week Lives in: House/apartment  PLOF: Needs assistance with ADLs  PATIENT GOALS    to  improve voice  OBJECTIVE:  COGNITION: Overall cognitive status: Within functional limits for tasks assessed  SOCIAL HISTORY: Occupation: retired Counsellor intake: optimal Caffeine/alcohol intake: only drinks decaffeinated Daily voice use: excessive Environmental risks: None reported Occupational risks: None identified Misuse: Speaks excessively, Hyperfunction, Strain, Tension, Speaks without adequate breath support, and Speaks on residual capacity "I talk a lot" a lot of church family, talks on the phone,  Phonotraumatic behaviors: Speaks in noise, Excessive voice use, Excessive voice use during colds/illnesses, and Excessive and/or habitual throat clearing  PERCEPTUAL VOICE ASSESSMENT: Voice quality: hoarse, breathy, harsh, rough, strained, diplophonia, and vocal fatigue Vocal abuse: habitual throat clearing, abnormal breathing pattern, excessive voice use, and habitual abnormal pitch Resonance: normal Respiratory function: clavicular breathing  OBJECTIVE VOICE ASSESSMENT: Sustained "ah" maximum phonation time: 7.3 seconds Sustained "ah" loudness average: 71.7 dB Average fundamental frequency during sustained "ah":176 Hz   (2.5 SD below average of  244 Hz +/- 27 for gender)  Oral reading (passage) loudness average: 69 dB Oral reading loudness range: 18 dB Conversational pitch average: 173 Hz Highest dynamic pitch in conversational speech: 268 Hz Lowest dynamic pitch in conversational speech: 117 Hz Conversational pitch range: 151 Hz Conversational loudness average: 68 dB Conversational loudness range: 20 dB S/z ratio: 1 (Suggestive of dysfunction >1.0) Voice quality: hoarse, breathy, low vocal intensity, diplophonia, and vocal fatigue Comments: Given pt's severe cervical arthritis, she has physical limitations on utilizing neck strategies and increasing respiratory support for phonation.      ORAL MOTOR EXAMINATION Facial : {OM Face:25663} Lingual: {OM Lingual:25664} Velum:  {OM Velum:25665} Mandible: {OM mandible:25667} Cough: {OM Cough:25660} Voice: {OM Voice:25662}   PATIENT REPORTED OUTCOME MEASURES (PROM): {SLPPROM:27785}   TODAY'S TREATMENT:  ***   PATIENT EDUCATION: Education details: *** Person educated: {Person educated:25204} Education method: {Education Method:25205} Education comprehension: {Education Comprehension:25206}   HOME EXERCISE PROGRAM: ***     GOALS: Goals reviewed with patient? {yes/no:20286}  SHORT TERM GOALS: Target date: 10 sessions  *** Baseline: Goal status: {GOALSTATUS:25110}  2.  *** Baseline:  Goal status: {GOALSTATUS:25110}  3.  *** Baseline:  Goal status: {GOALSTATUS:25110}  4.  *** Baseline:  Goal status: {GOALSTATUS:25110}  5.  *** Baseline:  Goal status: {GOALSTATUS:25110}  6.  *** Baseline:  Goal status: {GOALSTATUS:25110}  LONG TERM GOALS: Target date:   *** Baseline:  Goal status: {GOALSTATUS:25110}  2.  *** Baseline:  Goal status: {GOALSTATUS:25110}  3.  *** Baseline:  Goal status: {GOALSTATUS:25110}  4.  *** Baseline:  Goal status: {GOALSTATUS:25110}  5.  *** Baseline:  Goal status: {GOALSTATUS:25110}  6.  *** Baseline:  Goal status: {GOALSTATUS:25110}  ASSESSMENT:  CLINICAL IMPRESSION: Patient is a *** y.o. *** who was seen today for ***.   OBJECTIVE IMPAIRMENTS include {SLPOBJIMP:27107}. These impairments are limiting patient from {SLPLIMIT:27108}. Factors affecting potential to achieve goals and  functional outcome are {SLP factors:25450}. Patient will benefit from skilled SLP services to address above impairments and improve overall function.  REHAB POTENTIAL: {rehabpotential:25112}  PLAN: SLP FREQUENCY: {rehab frequency:25116}  SLP DURATION: {rehab duration:25117}  PLANNED INTERVENTIONS: {SLP treatment/interventions:25449}     [facility] Outpatient Rehabilitation at  [facility] [address] [place], [place],  [address] Phone: [phone]   Fax:  [phone]

## 2024-02-12 ENCOUNTER — Ambulatory Visit: Admitting: Speech Pathology

## 2024-02-12 ENCOUNTER — Telehealth: Payer: Self-pay | Admitting: Speech Pathology

## 2024-02-12 NOTE — Telephone Encounter (Signed)
 This Clinical research associate received message requesting callback from pt.   In speaking to pt over the telephone, he reports that she would only be able to attend 12 out of 15 scheduled appts and has checked with her insurance company. They cover HHST and she would like to receive ST services within the home.   This Clinical research associate with make doctor aware.   Osceola Depaz B. Dreama Saa, M.S., CCC-SLP, Tree surgeon Certified Brain Injury Specialist [facility]  [facility] Services Office [phone] Ascom [phone] Fax [phone]

## 2024-02-16 ENCOUNTER — Encounter: Admitting: Speech Pathology

## 2024-02-17 ENCOUNTER — Ambulatory Visit: Admitting: Speech Pathology

## 2024-02-18 ENCOUNTER — Encounter: Admitting: Speech Pathology

## 2024-02-24 ENCOUNTER — Encounter: Admitting: Speech Pathology

## 2024-02-25 ENCOUNTER — Encounter: Admitting: Speech Pathology

## 2024-03-01 ENCOUNTER — Encounter: Admitting: Speech Pathology

## 2024-03-03 ENCOUNTER — Encounter: Admitting: Speech Pathology

## 2024-03-09 ENCOUNTER — Encounter: Admitting: Speech Pathology

## 2024-03-11 ENCOUNTER — Encounter: Admitting: Speech Pathology

## 2024-03-16 ENCOUNTER — Encounter: Admitting: Speech Pathology

## 2024-03-18 ENCOUNTER — Encounter: Admitting: Speech Pathology

## 2024-03-23 ENCOUNTER — Encounter: Admitting: Speech Pathology

## 2024-03-25 ENCOUNTER — Encounter: Admitting: Speech Pathology

## 2024-03-30 ENCOUNTER — Encounter: Admitting: Speech Pathology

## 2024-04-01 ENCOUNTER — Encounter: Admitting: Speech Pathology

## 2024-04-06 ENCOUNTER — Encounter: Admitting: Speech Pathology

## 2024-04-08 ENCOUNTER — Encounter: Admitting: Speech Pathology

## 2024-04-13 ENCOUNTER — Encounter: Admitting: Speech Pathology

## 2024-04-15 ENCOUNTER — Encounter: Admitting: Speech Pathology

## 2024-04-20 ENCOUNTER — Encounter: Admitting: Speech Pathology

## 2024-04-22 ENCOUNTER — Encounter: Admitting: Speech Pathology

## 2024-04-27 ENCOUNTER — Encounter: Admitting: Speech Pathology

## 2024-04-29 ENCOUNTER — Encounter: Admitting: Speech Pathology

## 2024-05-04 ENCOUNTER — Encounter: Admitting: Speech Pathology

## 2024-05-06 ENCOUNTER — Encounter: Admitting: Speech Pathology

## 2024-05-11 ENCOUNTER — Encounter: Admitting: Speech Pathology

## 2024-05-13 ENCOUNTER — Encounter: Admitting: Speech Pathology

## 2024-05-18 ENCOUNTER — Encounter: Admitting: Speech Pathology

## 2024-05-20 ENCOUNTER — Encounter: Admitting: Speech Pathology

## 2024-06-29 ENCOUNTER — Other Ambulatory Visit: Payer: Self-pay | Admitting: Family Medicine

## 2024-06-29 DIAGNOSIS — Z1231 Encounter for screening mammogram for malignant neoplasm of breast: Secondary | ICD-10-CM

## 2024-06-30 ENCOUNTER — Encounter: Payer: Self-pay | Admitting: Obstetrics and Gynecology

## 2024-06-30 ENCOUNTER — Ambulatory Visit: Admitting: Obstetrics and Gynecology

## 2024-06-30 VITALS — BP 139/86 | HR 87 | Ht 60.0 in | Wt 114.0 lb

## 2024-06-30 DIAGNOSIS — N8111 Cystocele, midline: Secondary | ICD-10-CM | POA: Diagnosis not present

## 2024-06-30 DIAGNOSIS — L900 Lichen sclerosus et atrophicus: Secondary | ICD-10-CM

## 2024-06-30 DIAGNOSIS — Z01419 Encounter for gynecological examination (general) (routine) without abnormal findings: Secondary | ICD-10-CM

## 2024-06-30 NOTE — Progress Notes (Signed)
 HPI:      Crystal [name] is a 73 y.o. [phone] who LMP was No LMP recorded. Patient has had a hysterectomy.  Subjective:   She presents today for her well woman visit.  She reports no symptoms from her lichen sclerosus or her cystocele.  She likes to have it checked every year to make sure it is not worsening and that she does not need to do anything differently. Of significant note, patient lost her husband this last year after 52 years of marriage.  She seems like she is doing well despite her loss.    Hx: The following portions of the patient's history were reviewed and updated as appropriate:             She  has a past medical history of Asthma, Collagen vascular disease (HCC), Cystocele, Difficult intubation, DVT (deep venous thrombosis) (HCC) (01/2021), Enterocele, Gallstones (11/2018), GERD (gastroesophageal reflux disease), Muscle inflammation, Pulmonary embolism (HCC) (01/2021), Rectocele, Rheumatoid arthritis (HCC), Stress fracture of other site of femur due to multiple or repetitive stress, left, with malunion, subsequent encounter, and Vaginal atrophy. She does not have any pertinent problems on file. She  has a past surgical history that includes Total hip arthroplasty (Bilateral, [id number]); hand surgery (2002); Foot surgery (Bilateral, 1993); knee replacement (Bilateral, 1993); neck fusion (2000); Thyroid  surgery (1999); Shoulder arthroscopy (2001); Anterior and posterior vaginal repair (2009); Abdominal hysterectomy (1980); Carpal tunnel release (Left); Wrist surgery (Left, 1986); Temporomandibular joint arthroplasty (1991); Elbow surgery; Hip Closed Reduction (Right, 05/02/2019); Colonoscopy; and Colonoscopy with propofol  (N/A, 10/30/2021). Her family history includes Breast cancer in her cousin and maternal aunt; Breast cancer (age of onset: 54) in her paternal aunt; Breast cancer (age of onset: 28) in her maternal aunt; Colon cancer in her cousin; Diabetes in her brother,  mother, and paternal aunt. She  reports that she has never smoked. She has never used smokeless tobacco. She reports that she does not drink alcohol and does not use drugs. She has a current medication list which includes the following prescription(s): calcium-vitamin d, clobetasol  ointment, cyanocobalamin, etanercept, etodolac, ferrous sulfate , hydrochlorothiazide, methotrexate, multi-vitamins, multiple vitamins-minerals, omeprazole, prednisone , promethazine, synthroid , tramadol , and vitamin e. She is allergic to b12-c-fa-fe [iron-vit c-vit b12-folic acid], sulfa antibiotics, vitamin b12, alendronate sodium, demerol [meperidine], diclofenac, humira [adalimumab], hydrocodone -acetaminophen , ibandronate, morphine and codeine, nalfon [fenoprofen calcium], remicade [infliximab], sulfasalazine, imuran [azathioprine], oxaprozin, and oxcarbazepine.       Review of Systems:  Review of Systems  Constitutional: Denied constitutional symptoms, night sweats, recent illness, fatigue, fever, insomnia and weight loss.  Eyes: Denied eye symptoms, eye pain, photophobia, vision change and visual disturbance.  Ears/Nose/Throat/Neck: Denied ear, nose, throat or neck symptoms, hearing loss, nasal discharge, sinus congestion and sore throat.  Cardiovascular: Denied cardiovascular symptoms, arrhythmia, chest pain/pressure, edema, exercise intolerance, orthopnea and palpitations.  Respiratory: Denied pulmonary symptoms, asthma, pleuritic pain, productive sputum, cough, dyspnea and wheezing.  Gastrointestinal: Denied, gastro-esophageal reflux, melena, nausea and vomiting.  Genitourinary: Denied genitourinary symptoms including symptomatic vaginal discharge, pelvic relaxation issues, and urinary complaints.  Musculoskeletal: Denied musculoskeletal symptoms, stiffness, swelling, muscle weakness and myalgia.  Dermatologic: Denied dermatology symptoms, rash and scar.  Neurologic: Denied neurology symptoms, dizziness, headache,  neck pain and syncope.  Psychiatric: Denied psychiatric symptoms, anxiety and depression.  Endocrine: Denied endocrine symptoms including hot flashes and night sweats.   Meds:   Current Outpatient Medications on File Prior to Visit  Medication Sig Dispense Refill   Calcium-Vitamin D (CALTRATE 600 PLUS-VIT D PO) Take  1 tablet by mouth daily.      clobetasol  ointment (TEMOVATE ) 0.05 % Apply 1 Application topically every other day. 45 g 0   Cyanocobalamin 1000 MCG TBCR Take 1 tablet by mouth daily.      etanercept (ENBREL) 25 MG injection Inject 25 mg into the skin once a week.      etodolac (LODINE) 500 MG tablet Take 500 mg by mouth 2 (two) times daily.      ferrous sulfate  325 (65 FE) MG tablet Take 325 mg by mouth daily with breakfast.     hydrochlorothiazide (HYDRODIURIL) 25 MG tablet Take 25 mg by mouth daily.     methotrexate (RHEUMATREX) 2.5 MG tablet Take 10 mg by mouth once a week.     Multiple Vitamin (MULTI-VITAMINS) TABS Take 1 tablet by mouth daily.      Multiple Vitamins-Minerals (PRESERVISION AREDS 2 PO) Take 1 tablet by mouth daily.      omeprazole (PRILOSEC) 20 MG capsule Take 20 mg by mouth daily.      predniSONE  (DELTASONE ) 5 MG tablet Take 2.5 mg by mouth daily.      promethazine (PHENERGAN) 25 MG tablet Take 12.5-25 mg by mouth every 6 (six) hours as needed for nausea or vomiting.      SYNTHROID  100 MCG tablet Take 100 mcg by mouth daily.     traMADol  (ULTRAM ) 50 MG tablet Take 50 mg by mouth 2 (two) times daily as needed (pain).      vitamin E 400 UNIT capsule Take 400 Units by mouth daily.      No current facility-administered medications on file prior to visit.     Objective:     Vitals:   06/30/24 1102  BP: 139/86  Pulse: 87    Filed Weights   06/30/24 1102  Weight: 114 lb (51.7 kg)              Physical examination   Pelvic:   Vulva: Normal appearance.  No lesions.  Vagina: No lesions or abnormalities noted.  Support: Second-degree cystocele   Urethra No masses tenderness or scarring.  Meatus Normal size without lesions or prolapse.  Cervix: Surgically absent  Anus: Normal exam.  No lesions.  Perineum: Normal exam.  No lesions.    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 Midline cystocele   2. Lichen sclerosus     Patient doing well with lichen sclerosus.  No longer using clobetasol  and is not having symptoms.  Vulva appears healthy.  Cystocele unchanged and not symptomatic   Plan:  1.  Basic Screening Recommendations The basic screening recommendations for asymptomatic women were discussed with the patient during her visit.  The age-appropriate recommendations were discussed with her and the rational for the tests reviewed.  When I am informed by the patient that another primary care physician has previously obtained the age-appropriate tests and they are up-to-date, only outstanding tests are ordered and referrals given as necessary.  Abnormal results of tests will be discussed with her when all of her results  are completed.  Routine preventative health maintenance measures emphasized: Exercise/Diet/Weight control, Tobacco Warnings, Alcohol/Substance use risks and Stress Management Above performed by family medicine/primary care doctor. Orders No orders of the defined types were placed in this encounter.   No orders of the defined types were placed in this encounter.        F/U  Return in about 1 year (around 06/30/2025) for Annual Physical.  Alm DOROTHA Sar, M.D. 06/30/2024 11:16 AM

## 2024-07-02 ENCOUNTER — Other Ambulatory Visit: Payer: Self-pay | Admitting: Neurology

## 2024-07-02 DIAGNOSIS — R569 Unspecified convulsions: Secondary | ICD-10-CM

## 2024-07-05 ENCOUNTER — Ambulatory Visit
Admission: RE | Admit: 2024-07-05 | Discharge: 2024-07-05 | Disposition: A | Discharge status: Home / Self Care | Source: Ambulatory Visit | Attending: Neurology | Admitting: Neurology

## 2024-07-05 DIAGNOSIS — R569 Unspecified convulsions: Secondary | ICD-10-CM | POA: Diagnosis present

## 2024-07-07 ENCOUNTER — Other Ambulatory Visit: Payer: Self-pay | Admitting: Neurology

## 2024-07-07 DIAGNOSIS — M4802 Spinal stenosis, cervical region: Secondary | ICD-10-CM

## 2024-07-14 ENCOUNTER — Ambulatory Visit
Admission: RE | Admit: 2024-07-14 | Discharge: 2024-07-14 | Disposition: A | Discharge status: Home / Self Care | Source: Ambulatory Visit | Attending: Neurology | Admitting: Neurology

## 2024-07-14 DIAGNOSIS — M4802 Spinal stenosis, cervical region: Secondary | ICD-10-CM | POA: Insufficient documentation

## 2024-08-03 ENCOUNTER — Encounter

## 2024-08-10 ENCOUNTER — Encounter

## 2024-09-02 ENCOUNTER — Ambulatory Visit
Admission: RE | Admit: 2024-09-02 | Discharge: 2024-09-02 | Disposition: A | Discharge status: Home / Self Care | Source: Ambulatory Visit | Attending: Family Medicine | Admitting: Family Medicine

## 2024-09-02 DIAGNOSIS — Z1231 Encounter for screening mammogram for malignant neoplasm of breast: Secondary | ICD-10-CM | POA: Insufficient documentation

## 2024-11-29 DIAGNOSIS — Z96653 Presence of artificial knee joint, bilateral: Secondary | ICD-10-CM

## 2024-12-01 ENCOUNTER — Ambulatory Visit
Admission: RE | Admit: 2024-12-01 | Discharge: 2024-12-01 | Disposition: A | Discharge status: Home / Self Care | Source: Ambulatory Visit | Attending: Orthopedic Surgery | Admitting: Orthopedic Surgery

## 2024-12-01 ENCOUNTER — Other Ambulatory Visit
Admission: RE | Admit: 2024-12-01 | Discharge: 2024-12-01 | Disposition: A | Discharge status: Home / Self Care | Source: Ambulatory Visit | Attending: Orthopedic Surgery | Admitting: Orthopedic Surgery

## 2024-12-01 DIAGNOSIS — Z96653 Presence of artificial knee joint, bilateral: Secondary | ICD-10-CM | POA: Diagnosis present

## 2024-12-01 LAB — CBC WITH DIFFERENTIAL/PLATELET
Abs Immature Granulocytes: 0.02 K/uL (ref 0.00–0.07)
Basophils Absolute: 0 K/uL (ref 0.0–0.1)
Basophils Relative: 0 %
Eosinophils Absolute: 0.1 K/uL (ref 0.0–0.5)
Eosinophils Relative: 1 %
HCT: 38.9 % (ref 36.0–46.0)
Hemoglobin: 12.7 g/dL (ref 12.0–15.0)
Immature Granulocytes: 0 %
Lymphocytes Relative: 15 %
Lymphs Abs: 1.2 K/uL (ref 0.7–4.0)
MCH: 31.6 pg (ref 26.0–34.0)
MCHC: 32.6 g/dL (ref 30.0–36.0)
MCV: 96.8 fL (ref 80.0–100.0)
Monocytes Absolute: 0.5 K/uL (ref 0.1–1.0)
Monocytes Relative: 6 %
Neutro Abs: 6.2 K/uL (ref 1.7–7.7)
Neutrophils Relative %: 78 %
Platelets: 255 K/uL (ref 150–400)
RBC: 4.02 MIL/uL (ref 3.87–5.11)
RDW: 14.6 % (ref 11.5–15.5)
WBC: 8 K/uL (ref 4.0–10.5)
nRBC: 0 % (ref 0.0–0.2)

## 2024-12-01 LAB — SEDIMENTATION RATE: Sed Rate: 11 mm/h (ref 0–30)

## 2024-12-01 LAB — C-REACTIVE PROTEIN: CRP: 0.8 mg/dL

## 2024-12-01 MED ORDER — TECHNETIUM TC 99M MEDRONATE IV KIT
20.0000 | PACK | Freq: Once | INTRAVENOUS | Status: AC | PRN
  Administered 2024-12-01: 22.7 via INTRAVENOUS
